# Patient Record
Sex: Female | Born: 1985 | Race: White | Hispanic: No | Marital: Married | Smoking: Never smoker
Health system: Southern US, Community
[De-identification: ages and names within clinical notes are randomized; demographics above are authoritative.]

## PROBLEM LIST (undated history)

## (undated) DIAGNOSIS — F329 Major depressive disorder, single episode, unspecified: Secondary | ICD-10-CM

## (undated) DIAGNOSIS — Z860100 Personal history of colon polyps, unspecified: Secondary | ICD-10-CM

## (undated) DIAGNOSIS — K859 Acute pancreatitis without necrosis or infection, unspecified: Secondary | ICD-10-CM

## (undated) DIAGNOSIS — F32A Depression, unspecified: Secondary | ICD-10-CM

## (undated) DIAGNOSIS — K5190 Ulcerative colitis, unspecified, without complications: Secondary | ICD-10-CM

## (undated) DIAGNOSIS — F410 Panic disorder [episodic paroxysmal anxiety] without agoraphobia: Secondary | ICD-10-CM

## (undated) HISTORY — DX: Acute pancreatitis without necrosis or infection, unspecified: K85.90

## (undated) HISTORY — DX: Personal history of colon polyps, unspecified: Z86.0100

## (undated) HISTORY — DX: Personal history of colonic polyps: Z86.010

## (undated) HISTORY — DX: Ulcerative colitis, unspecified, without complications: K51.90

## (undated) HISTORY — DX: Panic disorder (episodic paroxysmal anxiety): F41.0

## (undated) HISTORY — PX: OTHER SURGICAL HISTORY: SHX169

## (undated) HISTORY — PX: COLONOSCOPY: SHX174

---

## 2007-02-04 ENCOUNTER — Ambulatory Visit: Payer: Self-pay | Admitting: Gastroenterology

## 2009-03-21 ENCOUNTER — Inpatient Hospital Stay: Payer: Self-pay | Admitting: Student

## 2009-04-22 ENCOUNTER — Ambulatory Visit: Payer: Self-pay | Admitting: Unknown Physician Specialty

## 2013-06-22 ENCOUNTER — Ambulatory Visit: Payer: Self-pay | Admitting: Gastroenterology

## 2013-06-22 LAB — HM COLONOSCOPY

## 2013-08-03 ENCOUNTER — Other Ambulatory Visit: Payer: Self-pay | Admitting: Gastroenterology

## 2013-08-04 LAB — CLOSTRIDIUM DIFFICILE(ARMC)

## 2015-03-01 ENCOUNTER — Other Ambulatory Visit: Payer: Self-pay | Admitting: Family Medicine

## 2015-03-16 ENCOUNTER — Ambulatory Visit (INDEPENDENT_AMBULATORY_CARE_PROVIDER_SITE_OTHER): Payer: BC Managed Care – PPO | Admitting: Family Medicine

## 2015-03-16 ENCOUNTER — Encounter: Payer: Self-pay | Admitting: Family Medicine

## 2015-03-16 VITALS — BP 112/70 | HR 56 | Temp 98.4°F | Resp 16 | Wt 187.0 lb

## 2015-03-16 DIAGNOSIS — F410 Panic disorder [episodic paroxysmal anxiety] without agoraphobia: Secondary | ICD-10-CM | POA: Insufficient documentation

## 2015-03-16 DIAGNOSIS — R0981 Nasal congestion: Secondary | ICD-10-CM | POA: Diagnosis not present

## 2015-03-16 DIAGNOSIS — Z86010 Personal history of colonic polyps: Secondary | ICD-10-CM | POA: Insufficient documentation

## 2015-03-16 DIAGNOSIS — K5190 Ulcerative colitis, unspecified, without complications: Secondary | ICD-10-CM | POA: Insufficient documentation

## 2015-03-16 MED ORDER — FLUTICASONE PROPIONATE 50 MCG/ACT NA SUSP: 2.0000 | Freq: Every day | NASAL | Status: DC

## 2015-03-16 NOTE — Progress Notes (Signed)
       Patient: Jo Perkins Female    DOB: 1986-03-02   29 y.o.   MRN: [id number] Visit Date: 03/16/2015  Today's Provider: Lelon Huh, MD   Chief Complaint  Patient presents with  . Nasal Congestion   Subjective:    HPI Pt reports that she had a cold in May which mostly resolved.About one week ago she started having persistent nasal congestion and started using Afrin 2-3 times a day. She now waking up in the morning unable to breath out of her nose and is concerned she has become dependent on Afrin.     Previous Medications   CLONAZEPAM (KLONOPIN) 0.5 MG TABLET    Take by mouth. 1 tablet every morning, then  1/2 tablet 1-2 more times daily as needed   MESALAMINE (APRISO) 0.375 G 24 HR CAPSULE    Take 4 capsules by mouth daily.   SERTRALINE (ZOLOFT) 100 MG TABLET    Take 1 tablet (100 mg total) by mouth daily.    Review of Systems  Constitutional: Positive for fatigue.  HENT: Positive for congestion and sneezing.   Eyes: Negative.   Respiratory: Negative.   Cardiovascular: Negative.   Gastrointestinal: Negative.   Endocrine: Negative.   Genitourinary: Negative.   Musculoskeletal: Negative.   Skin: Negative.   Allergic/Immunologic: Negative.   Neurological: Negative.   Hematological: Negative.  Does not bruise/bleed easily.  Psychiatric/Behavioral: Negative.     History  Substance Use Topics  . Smoking status: Never Smoker   . Smokeless tobacco: Not on file  . Alcohol Use: 0.0 oz/week    0 Standard drinks or equivalent per week     Comment: rare   Objective:   BP 112/70 mmHg  Pulse 56  Temp(Src) 98.4 F (36.9 C) (Oral)  Resp 16  Wt 187 lb (84.823 kg)  Physical Exam   General Appearance:    Alert, cooperative, no distress  ENT:   Nasal mucosa pale and congested with small amount of clear drainage.   Eyes:    PERRL, conjunctiva/corneas clear, EOM's intact       Lungs:     Clear to auscultation bilaterally, respirations unlabored  Heart:    Regular rate  and rhythm  Neurologic:   Awake, alert, oriented x 3. No apparent focal neurological           defect.           Assessment & Plan:     1. 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 Suspect she has some underlying allergies as well. Counseled no to use nasal decongestants at all and start using nasal saline a few times a day. Can also try nasal steroid. Call if not much better in a week.  - fluticasone (FLONASE) 50 MCG/ACT nasal spray; Place 2 sprays into both nostrils daily.  Dispense: 16 g; Refill: 6    Lelon Huh, MD  [facility]

## 2015-03-16 NOTE — Patient Instructions (Addendum)
   Use OTC saline nasal spray every 3-4 hours.   Do NOT use nasal decongestants such as Afrin

## 2015-03-17 ENCOUNTER — Ambulatory Visit: Payer: Self-pay | Admitting: Family Medicine

## 2015-06-16 ENCOUNTER — Other Ambulatory Visit: Payer: Self-pay | Admitting: Family Medicine

## 2015-06-16 NOTE — Telephone Encounter (Signed)
Please call in clonazepam

## 2015-06-17 NOTE — Telephone Encounter (Signed)
prescription called into pharmacy

## 2016-02-06 ENCOUNTER — Other Ambulatory Visit: Payer: Self-pay | Admitting: Family Medicine

## 2016-02-06 NOTE — Telephone Encounter (Signed)
Please call in clonazepam and advise patient that she is due for o.v. In June

## 2016-02-07 NOTE — Telephone Encounter (Signed)
Rx called into pharmacy. Called pt to advise she is due for ov. No answer and message that pt is not available. Will try again later.

## 2016-02-08 ENCOUNTER — Ambulatory Visit (INDEPENDENT_AMBULATORY_CARE_PROVIDER_SITE_OTHER): Payer: BC Managed Care – PPO | Admitting: Family Medicine

## 2016-02-08 ENCOUNTER — Encounter: Payer: Self-pay | Admitting: Family Medicine

## 2016-02-08 VITALS — BP 94/68 | HR 60 | Temp 98.6°F | Resp 16 | Wt 186.0 lb

## 2016-02-08 DIAGNOSIS — F410 Panic disorder [episodic paroxysmal anxiety] without agoraphobia: Secondary | ICD-10-CM | POA: Diagnosis not present

## 2016-02-08 DIAGNOSIS — K529 Noninfective gastroenteritis and colitis, unspecified: Secondary | ICD-10-CM | POA: Diagnosis not present

## 2016-02-08 MED ORDER — CLONAZEPAM 0.5 MG PO TABS: ORAL_TABLET | ORAL | Status: DC

## 2016-02-08 NOTE — Progress Notes (Signed)
       Patient: Jo Perkins Female    DOB: 1985/11/20   30 y.o.   MRN: [id number] Visit Date: 02/08/2016  Today's Provider: Lelon Huh, MD   Chief Complaint  Patient presents with  . Follow-up  . Panic Attack   Subjective:    HPI  Follow-up for panic attacks due to stress from 04/25/2014; increased Zoloft to 169m a day which she continues to take every day. Is tolerating well, mood is very good. Rarely gets the feeling she is about to have panic attack. She that happens she will usually take a klonipin which works very well.  Patient has had a bout of diarrhea and nausea sine Monday evening 5/232/2017. Had diarrhea Monday evening after she ate supper. Diarrhea lasted for a few hours. Since then has had some nausea and fatigue.     Allergies  Allergen Reactions  . Imuran  [Azathioprine]     caused pancreatitis   Previous Medications   CLONAZEPAM (KLONOPIN) 0.5 MG TABLET    TAKE 1 TABLET BY MOUTH IN THE MORNING AND 1/2 TABLET ONCE TO TWICE DAILY AS NEEDED   MESALAMINE (APRISO) 0.375 G 24 HR CAPSULE    Take 4 capsules by mouth daily.   SERTRALINE (ZOLOFT) 100 MG TABLET    Take 1 tablet (100 mg total) by mouth daily.    Review of Systems  Constitutional: Positive for fatigue. Negative for fever, chills and appetite change.  Respiratory: Negative for chest tightness and shortness of breath.   Cardiovascular: Negative for chest pain and palpitations.  Gastrointestinal: Positive for nausea and diarrhea. Negative for vomiting and abdominal pain.  Neurological: Negative for dizziness and weakness.    Social History  Substance Use Topics  . Smoking status: Never Smoker   . Smokeless tobacco: Not on file  . Alcohol Use: 0.0 oz/week    0 Standard drinks or equivalent per week     Comment: rare   Objective:   BP 94/68 mmHg  Pulse 60  Temp(Src) 98.6 F (37 C) (Oral)  Resp 16  Wt 186 lb (84.369 kg)  LMP 01/30/2016  Physical Exam  General Appearance:    Alert,  cooperative, no distress  Eyes:    PERRL, conjunctiva/corneas clear, EOM's intact       Lungs:     Clear to auscultation bilaterally, respirations unlabored  Heart:    Regular rate and rhythm  Abdomen:   bowel sounds present and normal in all 4 quadrants. No CVA tenderness        Assessment & Plan:     1. Panic Well controlled. She inquires whether or not she should stop taking medications. I think her panic disorder is long-standing, but advised her she could try cutting dose to 1/2 tablet a day. I would not recommend stopping all together. Refill clonazepam. Follow up in 1 year if doing well.   2. Gastroenteritis Nearly resolved. Advised to avoid dairy. Expect complete resolution in the next few days.        DLelon Huh MD  [facility]

## 2016-03-05 ENCOUNTER — Other Ambulatory Visit: Payer: Self-pay | Admitting: Family Medicine

## 2016-03-28 ENCOUNTER — Other Ambulatory Visit: Payer: Self-pay | Admitting: Nurse Practitioner

## 2016-03-28 ENCOUNTER — Ambulatory Visit
Admission: RE | Admit: 2016-03-28 | Discharge: 2016-03-28 | Disposition: A | Discharge status: Home / Self Care | Payer: BC Managed Care – PPO | Source: Ambulatory Visit | Attending: Nurse Practitioner | Admitting: Nurse Practitioner

## 2016-03-28 DIAGNOSIS — R109 Unspecified abdominal pain: Secondary | ICD-10-CM | POA: Insufficient documentation

## 2016-03-28 DIAGNOSIS — K859 Acute pancreatitis without necrosis or infection, unspecified: Secondary | ICD-10-CM | POA: Diagnosis not present

## 2016-03-28 DIAGNOSIS — Z8719 Personal history of other diseases of the digestive system: Secondary | ICD-10-CM | POA: Insufficient documentation

## 2016-03-28 DIAGNOSIS — R1909 Other intra-abdominal and pelvic swelling, mass and lump: Secondary | ICD-10-CM | POA: Insufficient documentation

## 2016-03-28 DIAGNOSIS — K8020 Calculus of gallbladder without cholecystitis without obstruction: Secondary | ICD-10-CM | POA: Insufficient documentation

## 2016-03-28 DIAGNOSIS — N289 Disorder of kidney and ureter, unspecified: Secondary | ICD-10-CM | POA: Insufficient documentation

## 2016-03-28 DIAGNOSIS — K51919 Ulcerative colitis, unspecified with unspecified complications: Secondary | ICD-10-CM

## 2016-03-28 MED ORDER — IOPAMIDOL (ISOVUE-300) INJECTION 61%
100.0000 mL | Freq: Once | INTRAVENOUS | Status: AC | PRN
  Administered 2016-03-28: 100 mL via INTRAVENOUS

## 2016-03-29 ENCOUNTER — Other Ambulatory Visit
Admission: RE | Admit: 2016-03-29 | Discharge: 2016-03-29 | Disposition: A | Discharge status: Home / Self Care | Payer: BC Managed Care – PPO | Source: Ambulatory Visit | Attending: Nurse Practitioner | Admitting: Nurse Practitioner

## 2016-03-29 ENCOUNTER — Emergency Department: Payer: BC Managed Care – PPO

## 2016-03-29 ENCOUNTER — Inpatient Hospital Stay
Admission: EM | Admit: 2016-03-29 | Discharge: 2016-03-31 | DRG: 439 | Disposition: A | Discharge status: Home / Self Care | Payer: BC Managed Care – PPO | Attending: Internal Medicine | Admitting: Internal Medicine

## 2016-03-29 ENCOUNTER — Encounter: Payer: Self-pay | Admitting: Urgent Care

## 2016-03-29 DIAGNOSIS — K51911 Ulcerative colitis, unspecified with rectal bleeding: Secondary | ICD-10-CM | POA: Diagnosis present

## 2016-03-29 DIAGNOSIS — R748 Abnormal levels of other serum enzymes: Secondary | ICD-10-CM | POA: Insufficient documentation

## 2016-03-29 DIAGNOSIS — K5190 Ulcerative colitis, unspecified, without complications: Secondary | ICD-10-CM | POA: Diagnosis present

## 2016-03-29 DIAGNOSIS — K861 Other chronic pancreatitis: Secondary | ICD-10-CM | POA: Diagnosis present

## 2016-03-29 DIAGNOSIS — R197 Diarrhea, unspecified: Secondary | ICD-10-CM | POA: Diagnosis not present

## 2016-03-29 DIAGNOSIS — F329 Major depressive disorder, single episode, unspecified: Secondary | ICD-10-CM | POA: Diagnosis present

## 2016-03-29 DIAGNOSIS — Z79899 Other long term (current) drug therapy: Secondary | ICD-10-CM

## 2016-03-29 DIAGNOSIS — K859 Acute pancreatitis without necrosis or infection, unspecified: Principal | ICD-10-CM | POA: Insufficient documentation

## 2016-03-29 DIAGNOSIS — Z86010 Personal history of colonic polyps: Secondary | ICD-10-CM

## 2016-03-29 DIAGNOSIS — Z833 Family history of diabetes mellitus: Secondary | ICD-10-CM

## 2016-03-29 DIAGNOSIS — R739 Hyperglycemia, unspecified: Secondary | ICD-10-CM | POA: Diagnosis present

## 2016-03-29 DIAGNOSIS — N390 Urinary tract infection, site not specified: Secondary | ICD-10-CM | POA: Diagnosis present

## 2016-03-29 DIAGNOSIS — Z8719 Personal history of other diseases of the digestive system: Secondary | ICD-10-CM

## 2016-03-29 DIAGNOSIS — Z888 Allergy status to other drugs, medicaments and biological substances status: Secondary | ICD-10-CM

## 2016-03-29 HISTORY — DX: Major depressive disorder, single episode, unspecified: F32.9

## 2016-03-29 HISTORY — DX: Depression, unspecified: F32.A

## 2016-03-29 LAB — COMPREHENSIVE METABOLIC PANEL
ALBUMIN: 4.1 g/dL (ref 3.5–5.0)
ALK PHOS: 63 U/L (ref 38–126)
ALT: 12 U/L — ABNORMAL LOW (ref 14–54)
ANION GAP: 6 (ref 5–15)
AST: 21 U/L (ref 15–41)
BUN: 9 mg/dL (ref 6–20)
CALCIUM: 9.4 mg/dL (ref 8.9–10.3)
CHLORIDE: 106 mmol/L (ref 101–111)
CO2: 26 mmol/L (ref 22–32)
Creatinine, Ser: 0.79 mg/dL (ref 0.44–1.00)
GFR calc non Af Amer: >60 mL/min (ref >60)
GLUCOSE: 116 mg/dL — AB (ref 65–99)
POTASSIUM: 4 mmol/L (ref 3.5–5.1)
SODIUM: 138 mmol/L (ref 135–145)
Total Bilirubin: 0.3 mg/dL (ref 0.3–1.2)
Total Protein: 7.5 g/dL (ref 6.5–8.1)

## 2016-03-29 LAB — URINALYSIS COMPLETE WITH MICROSCOPIC (ARMC ONLY)
BACTERIA UA: NONE SEEN
Bilirubin Urine: NEGATIVE
GLUCOSE, UA: NEGATIVE mg/dL
Ketones, ur: NEGATIVE mg/dL
Nitrite: NEGATIVE
PROTEIN: NEGATIVE mg/dL
SPECIFIC GRAVITY, URINE: 1.014 (ref 1.005–1.030)
pH: 7 (ref 5.0–8.0)

## 2016-03-29 LAB — GASTROINTESTINAL PANEL BY PCR, STOOL (REPLACES STOOL CULTURE)
Adenovirus F40/41: NOT DETECTED
Astrovirus: NOT DETECTED
CAMPYLOBACTER SPECIES: NOT DETECTED
CRYPTOSPORIDIUM: NOT DETECTED
Cyclospora cayetanensis: NOT DETECTED
E. COLI O157: NOT DETECTED
ENTEROAGGREGATIVE E COLI (EAEC): NOT DETECTED
Entamoeba histolytica: NOT DETECTED
Enteropathogenic E coli (EPEC): NOT DETECTED
Enterotoxigenic E coli (ETEC): NOT DETECTED
GIARDIA LAMBLIA: NOT DETECTED
NOROVIRUS GI/GII: NOT DETECTED
PLESIMONAS SHIGELLOIDES: NOT DETECTED
Rotavirus A: NOT DETECTED
SALMONELLA SPECIES: NOT DETECTED
SAPOVIRUS (I, II, IV, AND V): NOT DETECTED
SHIGELLA/ENTEROINVASIVE E COLI (EIEC): NOT DETECTED
Shiga like toxin producing E coli (STEC): NOT DETECTED
Vibrio cholerae: NOT DETECTED
Vibrio species: NOT DETECTED
YERSINIA ENTEROCOLITICA: NOT DETECTED

## 2016-03-29 LAB — CBC
HEMATOCRIT: 36.9 % (ref 35.0–47.0)
HEMOGLOBIN: 12.5 g/dL (ref 12.0–16.0)
MCH: 28.4 pg (ref 26.0–34.0)
MCHC: 33.7 g/dL (ref 32.0–36.0)
MCV: 84.2 fL (ref 80.0–100.0)
Platelets: 270 10*3/uL (ref 150–440)
RBC: 4.38 MIL/uL (ref 3.80–5.20)
RDW: 14.2 % (ref 11.5–14.5)
WBC: 9.3 10*3/uL (ref 3.6–11.0)

## 2016-03-29 LAB — LIPASE, BLOOD: LIPASE: 1375 U/L — AB (ref 11–51)

## 2016-03-29 LAB — C DIFFICILE QUICK SCREEN W PCR REFLEX
C DIFFICILE (CDIFF) INTERP: NOT DETECTED
C DIFFICILE (CDIFF) TOXIN: NEGATIVE
C DIFFICLE (CDIFF) ANTIGEN: NEGATIVE

## 2016-03-29 LAB — POCT PREGNANCY, URINE: PREG TEST UR: NEGATIVE

## 2016-03-29 MED ORDER — ONDANSETRON HCL 4 MG/2ML IJ SOLN
4.0000 mg | Freq: Once | INTRAMUSCULAR | Status: AC
  Administered 2016-03-29: 4 mg via INTRAVENOUS
  Filled 2016-03-29: qty 2

## 2016-03-29 MED ORDER — MORPHINE SULFATE (PF) 4 MG/ML IV SOLN
4.0000 mg | Freq: Once | INTRAVENOUS | Status: AC
  Administered 2016-03-29: 4 mg via INTRAVENOUS
  Filled 2016-03-29: qty 1

## 2016-03-29 NOTE — ED Notes (Signed)
Patient asked for something for her abdominal pain, MD gave VO for 4mg  morphine IV, 4mg  zofran IV - RB by this RN.  This RN will enter orders and administer.

## 2016-03-29 NOTE — ED Notes (Signed)
Patient has hx of UC and has been treated by primary care doc and her pain has been increasing steadily.  She has taken a stool sample and had an appt with her doctor but her pain is increasing to the point she came to the ER for relief.

## 2016-03-29 NOTE — ED Notes (Signed)
Patient presents with c/o diffuse abdominal pain. Patient with an ulcerative colitis Dx. [facility] and [facility]; took stool sample back in this am. CBC and CMP were normal; CRP 7; Sed rate 51. Patient reports that the provider told her that she wanted to get the results back from the stool sample prior to starting therapy, however ABX and steroids were the likely course of treatment. Patient presents tonight reporting that she has been unable to get in touch with the GI provider Daphine Deutscher([place] and that her pain is "getting worse".

## 2016-03-29 NOTE — ED Notes (Signed)
Spoke with Inocencio HomesGayle, MD - wants abdominal protocol only at this time.

## 2016-03-29 NOTE — ED Notes (Signed)
Patient transported to Ultrasound 

## 2016-03-29 NOTE — ED Provider Notes (Signed)
[facility] Emergency Department Provider Note   ____________________________________________  Time seen: Approximately 9:56 PM  I have reviewed the triage vital signs and the nursing notes.   HISTORY  Chief Complaint Abdominal Pain    HPI Jo Perkins is a 30 y.o. female patient reports increasing abdominal pain. The CT and an ultrasound yesterday which only showed some gallstones. She has a history of ulcerative colitis. She is not having any diarrhea. She says this pain reminds her one time of when she had pancreatitis from allergic reaction to Imuran. He is not currently having any nausea or vomiting. The pain is moderately severe. Achy in the upper abdomen. It's getting gradually worse but nothing she does seems to help one way or the other. She has blood in urine but reports she is on her menstrual period.   Past Medical History  Diagnosis Date  . Panic attack   . History of colon polyps   . Ulcerative colitis ([place])   . Depression     Patient Active Problem List   Diagnosis Date Noted  . History of colon polyps 03/16/2015  . Panic 03/16/2015  . Colitis gravis ([place]) 03/16/2015    History reviewed. No pertinent past surgical history.  Current Outpatient Rx  Name  Route  Sig  Dispense  Refill  . ciprofloxacin (CIPRO) 500 MG tablet   Oral   Take 500 mg by mouth 2 (two) times daily.         . clonazePAM (KLONOPIN) 0.5 MG tablet      1/2-1 tablet every four hours as needed. Patient taking differently: Take 0.5 mg by mouth 2 (two) times daily. 0.5 mg in the am and .25-0.5 mg as needed   30 tablet   1   . Mesalamine (ASACOL) 400 MG CPDR DR capsule   Oral   Take 800 mg by mouth 3 (three) times daily.          . sertraline (ZOLOFT) 100 MG tablet      TAKE 1 TABLET BY MOUTH EVERY DAY   30 tablet   11     Allergies Imuran   Family History  Problem Relation Age of Onset  . Diabetes Maternal Grandmother     type 2  . Stroke  Maternal Grandmother   . Heart attack Maternal Grandmother   . Lung cancer Maternal Grandfather     Social History Social History  Substance Use Topics  . Smoking status: Never Smoker   . Smokeless tobacco: None  . Alcohol Use: 0.0 oz/week    0 Standard drinks or equivalent per week     Comment: rare    Review of Systems Constitutional: No fever/chills Eyes: No visual changes. ENT: No sore throat. Cardiovascular: Denies chest pain. Respiratory: Denies shortness of breath. Gastrointestinal: See history of present illness Genitourinary: Negative for dysuria. Musculoskeletal: Negative for back pain. Skin: Negative for rash. Neurological: Negative for headaches, focal weakness or numbness.  10-point ROS otherwise negative.  ____________________________________________   PHYSICAL EXAM:  VITAL SIGNS: ED Triage Vitals  Enc Vitals Group     BP 03/29/16 1958 114/76 mmHg     Pulse Rate 03/29/16 1958 63     Resp 03/29/16 1958 16     Temp 03/29/16 1958 98 F (36.7 C)     Temp Source 03/29/16 1958 Oral     SpO2 03/29/16 1958 98 %     Weight 03/29/16 1958 177 lb (80.287 kg)     Height  03/29/16 1958 5' 9"  (1.753 m)     Head Cir --      Peak Flow --      Pain Score 03/29/16 1958 7     Pain Loc --      Pain Edu? --      Excl. in [place]? --     Constitutional: Alert and oriented. Well appearing and in no acute distress. Eyes: Conjunctivae are normal. PERRL. EOMI. Head: Atraumatic. Nose: No congestion/rhinnorhea. Mouth/Throat: Mucous membranes are moist.  Oropharynx non-erythematous. Neck: No stridor Cardiovascular: Normal rate, regular rhythm. Grossly normal heart sounds.  Good peripheral circulation. Respiratory: Normal respiratory effort.  No retractions. Lungs CTAB. Gastrointestinal: Soft and Mildly tender palpation and percussion in the upper abdomen No distention. No abdominal bruits. No CVA tenderness. }Musculoskeletal: No lower extremity tenderness nor edema.  No joint  effusions. Neurologic:  Normal speech and language. No gross focal neurologic deficits are appreciated. No gait instability. Skin:  Skin is warm, dry and intact. No rash noted. Psychiatric: Mood and affect are normal. Speech and behavior are normal.  ____________________________________________   LABS (all labs ordered are listed, but only abnormal results are displayed)  Labs Reviewed  LIPASE, BLOOD - Abnormal; Notable for the following:    Lipase 1375 (*)    All other components within normal limits  COMPREHENSIVE METABOLIC PANEL - Abnormal; Notable for the following:    Glucose, Bld 116 (*)    ALT 12 (*)    All other components within normal limits  URINALYSIS COMPLETEWITH MICROSCOPIC ([facility] ONLY) - Abnormal; Notable for the following:    Color, Urine YELLOW (*)    APPearance CLEAR (*)    Hgb urine dipstick 3+ (*)    Leukocytes, UA 1+ (*)    Squamous Epithelial / LPF 0-5 (*)    All other components within normal limits  CBC  PREGNANCY, URINE  POCT PREGNANCY, URINE   ____________________________________________  EKG   ____________________________________________  RADIOLOGY  Ultrasound pending ____________________________________________   PROCEDURES    Procedures    ____________________________________________   INITIAL IMPRESSION / ASSESSMENT AND PLAN / ED COURSE  Pertinent labs & imaging results that were available during my care of the patient were reviewed by me and considered in my medical decision making (see chart for details).   ____________________________________________   FINAL CLINICAL IMPRESSION(S) / ED DIAGNOSES  Final diagnoses:  Acute pancreatitis, unspecified pancreatitis type      NEW MEDICATIONS STARTED DURING THIS VISIT:  New Prescriptions   No medications on file     Note:  This document was prepared using Dragon voice recognition software and may include unintentional dictation errors.    Nena Polio,  MD 03/29/16 2200

## 2016-03-29 NOTE — ED Notes (Signed)
Discussed IV options with patient, AC placement preferred. 

## 2016-03-29 NOTE — ED Notes (Signed)
MD at bedside. 

## 2016-03-29 NOTE — ED Notes (Signed)
Pt has ulcerative colitis states feels like she is having a flare up

## 2016-03-30 ENCOUNTER — Encounter: Payer: Self-pay | Admitting: Internal Medicine

## 2016-03-30 DIAGNOSIS — R739 Hyperglycemia, unspecified: Secondary | ICD-10-CM | POA: Diagnosis present

## 2016-03-30 DIAGNOSIS — Z8719 Personal history of other diseases of the digestive system: Secondary | ICD-10-CM | POA: Diagnosis not present

## 2016-03-30 DIAGNOSIS — K859 Acute pancreatitis without necrosis or infection, unspecified: Secondary | ICD-10-CM | POA: Insufficient documentation

## 2016-03-30 DIAGNOSIS — R748 Abnormal levels of other serum enzymes: Secondary | ICD-10-CM

## 2016-03-30 DIAGNOSIS — Z833 Family history of diabetes mellitus: Secondary | ICD-10-CM | POA: Diagnosis not present

## 2016-03-30 DIAGNOSIS — K861 Other chronic pancreatitis: Secondary | ICD-10-CM | POA: Diagnosis present

## 2016-03-30 DIAGNOSIS — Z79899 Other long term (current) drug therapy: Secondary | ICD-10-CM | POA: Diagnosis not present

## 2016-03-30 DIAGNOSIS — K5190 Ulcerative colitis, unspecified, without complications: Secondary | ICD-10-CM | POA: Diagnosis present

## 2016-03-30 DIAGNOSIS — F329 Major depressive disorder, single episode, unspecified: Secondary | ICD-10-CM | POA: Diagnosis present

## 2016-03-30 DIAGNOSIS — N390 Urinary tract infection, site not specified: Secondary | ICD-10-CM | POA: Diagnosis present

## 2016-03-30 DIAGNOSIS — R109 Unspecified abdominal pain: Secondary | ICD-10-CM | POA: Diagnosis present

## 2016-03-30 DIAGNOSIS — Z888 Allergy status to other drugs, medicaments and biological substances status: Secondary | ICD-10-CM | POA: Diagnosis not present

## 2016-03-30 DIAGNOSIS — Z86010 Personal history of colonic polyps: Secondary | ICD-10-CM | POA: Diagnosis not present

## 2016-03-30 LAB — HEMOGLOBIN A1C: Hgb A1c MFr Bld: 5.2 % (ref 4.0–6.0)

## 2016-03-30 LAB — TSH: TSH: 3.928 u[IU]/mL (ref 0.350–4.500)

## 2016-03-30 MED ORDER — DICYCLOMINE HCL 10 MG PO CAPS
10.0000 mg | ORAL_CAPSULE | Freq: Four times a day (QID) | ORAL | Status: DC
  Administered 2016-03-30 – 2016-03-31 (×5): 10 mg via ORAL
  Filled 2016-03-30 (×5): qty 1

## 2016-03-30 MED ORDER — ENOXAPARIN SODIUM 40 MG/0.4ML ~~LOC~~ SOLN: 40.0000 mg | SUBCUTANEOUS | Status: DC

## 2016-03-30 MED ORDER — BOOST / RESOURCE BREEZE PO LIQD
1.0000 | Freq: Three times a day (TID) | ORAL | Status: DC
  Administered 2016-03-30 (×2): 1 via ORAL

## 2016-03-30 MED ORDER — ONDANSETRON HCL 4 MG/2ML IJ SOLN
4.0000 mg | Freq: Four times a day (QID) | INTRAMUSCULAR | Status: DC | PRN
  Administered 2016-03-30: 4 mg via INTRAVENOUS

## 2016-03-30 MED ORDER — HYDROCODONE-ACETAMINOPHEN 5-325 MG PO TABS
1.0000 | ORAL_TABLET | ORAL | Status: DC | PRN
  Administered 2016-03-30 (×3): 2 via ORAL
  Administered 2016-03-30: 02:00:00 1 via ORAL
  Administered 2016-03-31: 2 via ORAL
  Filled 2016-03-30: qty 2
  Filled 2016-03-30: qty 1
  Filled 2016-03-30 (×3): qty 2

## 2016-03-30 MED ORDER — MORPHINE SULFATE (PF) 2 MG/ML IV SOLN
1.0000 mg | INTRAVENOUS | Status: DC | PRN
  Administered 2016-03-30 (×2): 1 mg via INTRAVENOUS
  Filled 2016-03-30 (×2): qty 1

## 2016-03-30 MED ORDER — ONDANSETRON HCL 4 MG PO TABS: 4.0000 mg | ORAL_TABLET | Freq: Four times a day (QID) | ORAL | Status: DC | PRN

## 2016-03-30 MED ORDER — SERTRALINE HCL 100 MG PO TABS
100.0000 mg | ORAL_TABLET | Freq: Every day | ORAL | Status: DC
  Administered 2016-03-30 – 2016-03-31 (×2): 100 mg via ORAL
  Filled 2016-03-30 (×2): qty 1

## 2016-03-30 MED ORDER — MORPHINE SULFATE (PF) 2 MG/ML IV SOLN
INTRAVENOUS | Status: AC
  Administered 2016-03-30: 1 mg via INTRAVENOUS
  Filled 2016-03-30: qty 1

## 2016-03-30 MED ORDER — DEXTROSE 5 % IV SOLN
1.0000 g | INTRAVENOUS | Status: DC
  Administered 2016-03-30 – 2016-03-31 (×2): 1 g via INTRAVENOUS
  Filled 2016-03-30 (×2): qty 10

## 2016-03-30 MED ORDER — DOCUSATE SODIUM 100 MG PO CAPS
100.0000 mg | ORAL_CAPSULE | Freq: Two times a day (BID) | ORAL | Status: DC
  Administered 2016-03-30 (×2): 100 mg via ORAL
  Filled 2016-03-30 (×2): qty 1

## 2016-03-30 MED ORDER — ACETAMINOPHEN 650 MG RE SUPP
650.0000 mg | Freq: Four times a day (QID) | RECTAL | Status: DC | PRN
Start: 2016-03-30 — End: 2016-03-31

## 2016-03-30 MED ORDER — ACETAMINOPHEN 325 MG PO TABS: 650.0000 mg | ORAL_TABLET | Freq: Four times a day (QID) | ORAL | Status: DC | PRN

## 2016-03-30 MED ORDER — MESALAMINE 400 MG PO CPDR
800.0000 mg | DELAYED_RELEASE_CAPSULE | Freq: Three times a day (TID) | ORAL | Status: DC
  Administered 2016-03-30 – 2016-03-31 (×4): 800 mg via ORAL
  Filled 2016-03-30 (×6): qty 2

## 2016-03-30 MED ORDER — ONDANSETRON HCL 4 MG/2ML IJ SOLN
INTRAMUSCULAR | Status: AC
  Administered 2016-03-30: 4 mg via INTRAVENOUS
  Filled 2016-03-30: qty 2

## 2016-03-30 MED ORDER — CLONAZEPAM 0.5 MG PO TABS
0.2500 mg | ORAL_TABLET | Freq: Two times a day (BID) | ORAL | Status: DC
  Administered 2016-03-30 (×2): 0.5 mg via ORAL
  Filled 2016-03-30 (×3): qty 1

## 2016-03-30 MED ORDER — SODIUM CHLORIDE 0.9 % IV SOLN
INTRAVENOUS | Status: DC
  Administered 2016-03-30 – 2016-03-31 (×5): via INTRAVENOUS

## 2016-03-30 MED ORDER — BUDESONIDE 3 MG PO CPEP
9.0000 mg | ORAL_CAPSULE | Freq: Every day | ORAL | Status: DC
  Administered 2016-03-30 – 2016-03-31 (×2): 9 mg via ORAL
  Filled 2016-03-30 (×2): qty 3

## 2016-03-30 NOTE — H&P (Signed)
Jo Perkins is an 30 y.o. female.   Chief Complaint: Abdominal pain HPI: The patient with past medical history of ulcerative colitis presents emergency department complaining of abdominal pain. She also admits to diarrhea that has had a lot of mucus and it for the last 2 weeks. She had been out of town but was able to see her gastroenterologist when she returned. C. difficile assay was obtained and the patient had a CT of the abdomen the day before admission which showed noncalcified gallstones and sludge within the gallbladder. Her pain was waxing and waning at that point but worsened and became constant today. In the emergency Department laboratory evaluation revealed elevated lipase as well as UTI. Due to these findings emergency department staff called for admission.  Past Medical History  Diagnosis Date  . Panic attack   . History of colon polyps   . Ulcerative colitis ([place])   . Depression     Past Surgical History  Procedure Laterality Date  . None      Family History  Problem Relation Age of Onset  . Diabetes Maternal Grandmother     type 2  . Stroke Maternal Grandmother   . Heart attack Maternal Grandmother   . Lung cancer Maternal Grandfather    Social History:  reports that she has never smoked. She does not have any smokeless tobacco history on file. She reports that she drinks alcohol. She reports that she does not use illicit drugs.  Allergies:  Allergies  Allergen Reactions  . Imuran  [Azathioprine]     caused pancreatitis    Medications Prior to Admission  Medication Sig Dispense Refill  . ciprofloxacin (CIPRO) 500 MG tablet Take 500 mg by mouth 2 (two) times daily.    . clonazePAM (KLONOPIN) 0.5 MG tablet 1/2-1 tablet every four hours as needed. (Patient taking differently: Take 0.5 mg by mouth 2 (two) times daily. 0.5 mg in the am and .25-0.5 mg as needed) 30 tablet 1  . dicyclomine (BENTYL) 10 MG capsule Take 10 mg by mouth 4 (four) times daily.    .  Mesalamine (ASACOL) 400 MG CPDR DR capsule Take 800 mg by mouth 3 (three) times daily.     . metroNIDAZOLE (FLAGYL) 250 MG tablet Take 250 mg by mouth 4 (four) times daily.    . sertraline (ZOLOFT) 100 MG tablet TAKE 1 TABLET BY MOUTH EVERY DAY 30 tablet 11    Results for orders placed or performed during the hospital encounter of 03/29/16 (from the past 48 hour(s))  Lipase, blood     Status: Abnormal   Collection Time: 03/29/16  8:09 PM  Result Value Ref Range   Lipase 1375 (H) 11 - 51 U/L    Comment: RESULT CONFIRMED BY MANUAL DILUTION  Comprehensive metabolic panel     Status: Abnormal   Collection Time: 03/29/16  8:09 PM  Result Value Ref Range   Sodium 138 135 - 145 mmol/L   Potassium 4.0 3.5 - 5.1 mmol/L   Chloride 106 101 - 111 mmol/L   CO2 26 22 - 32 mmol/L   Glucose, Bld 116 (H) 65 - 99 mg/dL   BUN 9 6 - 20 mg/dL   Creatinine, Ser 0.79 0.44 - 1.00 mg/dL   Calcium 9.4 8.9 - 10.3 mg/dL   Total Protein 7.5 6.5 - 8.1 g/dL   Albumin 4.1 3.5 - 5.0 g/dL   AST 21 15 - 41 U/L   ALT 12 (L) 14 - 54 U/L  Alkaline Phosphatase 63 38 - 126 U/L   Total Bilirubin 0.3 0.3 - 1.2 mg/dL   GFR calc non Af Amer >60 >60 mL/min   GFR calc Af Amer >60 >60 mL/min    Comment: (NOTE) The eGFR has been calculated using the CKD EPI equation. This calculation has not been validated in all clinical situations. eGFR's persistently <60 mL/min signify possible Chronic Kidney Disease.    Anion gap 6 5 - 15  CBC     Status: None   Collection Time: 03/29/16  8:09 PM  Result Value Ref Range   WBC 9.3 3.6 - 11.0 K/uL   RBC 4.38 3.80 - 5.20 MIL/uL   Hemoglobin 12.5 12.0 - 16.0 g/dL   HCT 36.9 35.0 - 47.0 %   MCV 84.2 80.0 - 100.0 fL   MCH 28.4 26.0 - 34.0 pg   MCHC 33.7 32.0 - 36.0 g/dL   RDW 14.2 11.5 - 14.5 %   Platelets 270 150 - 440 K/uL  Hemoglobin A1c     Status: None   Collection Time: 03/29/16  8:09 PM  Result Value Ref Range   Hgb A1c MFr Bld 5.2 4.0 - 6.0 %  TSH     Status: None    Collection Time: 03/29/16  8:09 PM  Result Value Ref Range   TSH 3.928 0.350 - 4.500 uIU/mL  Urinalysis complete, with microscopic     Status: Abnormal   Collection Time: 03/29/16  8:10 PM  Result Value Ref Range   Color, Urine YELLOW (A) YELLOW   APPearance CLEAR (A) CLEAR   Glucose, UA NEGATIVE NEGATIVE mg/dL   Bilirubin Urine NEGATIVE NEGATIVE   Ketones, ur NEGATIVE NEGATIVE mg/dL   Specific Gravity, Urine 1.014 1.005 - 1.030   Hgb urine dipstick 3+ (A) NEGATIVE   pH 7.0 5.0 - 8.0   Protein, ur NEGATIVE NEGATIVE mg/dL   Nitrite NEGATIVE NEGATIVE   Leukocytes, UA 1+ (A) NEGATIVE   RBC / HPF TOO NUMEROUS TO COUNT 0 - 5 RBC/hpf   WBC, UA 6-30 0 - 5 WBC/hpf   Bacteria, UA NONE SEEN NONE SEEN   Squamous Epithelial / LPF 0-5 (A) NONE SEEN  Pregnancy, urine POC     Status: None   Collection Time: 03/29/16  8:13 PM  Result Value Ref Range   Preg Test, Ur NEGATIVE NEGATIVE    Comment:        THE SENSITIVITY OF THIS METHODOLOGY IS >24 mIU/mL    US Abdomen Complete  03/29/2016  CLINICAL DATA:  Elevated lipase. Abdominal pain for 6 days. History of ulcerative colitis. EXAM: ABDOMEN ULTRASOUND COMPLETE COMPARISON:  CT abdomen and pelvis 03/28/2016 FINDINGS: Gallbladder: No gallstones or wall thickening visualized. No sonographic Murphy sign noted by sonographer. Common bile duct: Diameter: 3.5 mm, normal Liver: No focal lesion identified. Within normal limits in parenchymal echogenicity. IVC: No abnormality visualized. Pancreas: Visualized portion unremarkable. Spleen: Size and appearance within normal limits. Right Kidney: Length: 11.3 cm. Echogenicity within normal limits. No mass or hydronephrosis visualized. Left Kidney: Length: 10.8 cm. Echogenicity within normal limits. No mass or hydronephrosis visualized. Abdominal aorta: No aneurysm visualized. Other findings: None. IMPRESSION: Normal examination. Electronically Signed   By: Lucienne Capers M.D.   On: 03/29/2016 23:15   Ct Abdomen  Pelvis W Contrast  03/28/2016  CLINICAL DATA:  30 year old female complaining of severe abdominal pain cramping. Diarrhea and nausea for the past 3 weeks. History of ulcerative colitis. EXAM: CT ABDOMEN AND PELVIS WITH CONTRAST TECHNIQUE:  Multidetector CT imaging of the abdomen and pelvis was performed using the standard protocol following bolus administration of intravenous contrast. CONTRAST:  156m ISOVUE-300 IOPAMIDOL (ISOVUE-300) INJECTION 61% COMPARISON:  No priors. FINDINGS: Lower chest:  Unremarkable. Hepatobiliary: No cystic or solid hepatic lesions. No intra or extrahepatic biliary ductal dilatation. Gallbladder is remarkable for some amorphous intermediate attenuation material lying dependently, which may represent noncalcified gallstones and/or biliary sludge. No signs to suggest an acute cholecystitis at this time. Pancreas: No pancreatic mass. No pancreatic ductal dilatation. No pancreatic or peripancreatic fluid or inflammatory changes. Spleen: Unremarkable. Adrenals/Urinary Tract: Bilateral adrenal glands and bilateral kidneys are normal in appearance. Enhancing soft tissue lesion adjacent to the upper pole the left kidney, which on some images may arise from the upper pole the left kidney. This lesion measures 1.2 x 1.4 x 1.4 cm (axial image 21 of series 2 and coronal image 95 of series 5). Right kidney is normal in appearance. No hydroureteronephrosis. Bilateral adrenal glands are normal in appearance. Stomach/Bowel: Normal appearance of the stomach. No pathologic dilatation of small bowel or colon. Extending from the proximal descending colon through the distal rectum there is subjectively increased mucosal enhancement, and slight hypervascularity in the associated mesocolon/mesorectum. No overt wall thickening or inflammatory changes otherwise noted to suggest acute flare of patient's ulcerative colitis. Normal appendix. Vascular/Lymphatic: No significant atherosclerotic disease, aneurysm or  dissection identified in the abdominal or pelvic vasculature. Multiple prominent but non pathologically enlarged retroperitoneal lymph nodes are noted (nonspecific). 8 mm short axis mesorectal lymph node (image 71 of series 2) is noted, but nonspecific. No pathologically enlarged lymph nodes are identified in the abdomen or pelvis. Reproductive: Retroverted uterus. Ovaries are unremarkable in appearance. Other: No significant volume of ascites.  No pneumoperitoneum. Musculoskeletal: There are no aggressive appearing lytic or blastic lesions noted in the visualized portions of the skeleton. IMPRESSION: 1. Biliary sludge and/or noncalcified gallstones lying dependently in the gallbladder. No findings to suggest acute cholecystitis at this time. If there is clinical concern for acute cholecystitis, further evaluation with right upper quadrant ultrasound could provide additional diagnostic information. 2. Enhancing lesion in the upper left retroperitoneum which is strongly favored to represent a splenule. However, the possibility of an exophytic solid renal lesion extending off the upper pole the left kidney cannot be excluded on the basis of today's examination (as the intervening fat plane between the lesion in the kidney is obscured on several axial, sagittal and coronal images). Accordingly, further evaluation with nonemergent MRI of the abdomen with and without IV gadolinium is recommended in the near future. 3. Mild hyperenhancement of the mucosa extending from the proximal descending colon through the distal rectum, and hypervascularity in the associated mesocolon/mesorectum. These findings are compatible with the reported clinical history of ulcerative colitis. No other overt findings to suggest acute colonic inflammation at this time. These results will be called to the ordering clinician or representative by the Radiologist Assistant, and communication documented in the PACS or zVision Dashboard.  Electronically Signed   By: DVinnie LangtonM.D.   On: 03/28/2016 15:09    Review of Systems  Constitutional: Negative for fever and chills.  HENT: Negative for sore throat and tinnitus.   Eyes: Negative for blurred vision and redness.  Respiratory: Negative for cough and shortness of breath.   Cardiovascular: Negative for chest pain, palpitations, orthopnea and PND.  Gastrointestinal: Positive for diarrhea. Negative for nausea, vomiting and abdominal pain.  Genitourinary: Negative for dysuria, urgency and frequency.  Musculoskeletal: Negative  for myalgias and joint pain.  Skin: Negative for rash.       No lesions  Neurological: Negative for speech change, focal weakness and weakness.  Endo/Heme/Allergies: Does not bruise/bleed easily.       No temperature intolerance  Psychiatric/Behavioral: Negative for depression and suicidal ideas.    Blood pressure 102/62, pulse 53, temperature 97.8 F (36.6 C), temperature source Oral, resp. rate 20, height _0  (1.753 m), weight 80.797 kg (178 lb 2 oz), last menstrual period 03/28/2016, SpO2 100 %. Physical Exam  Vitals reviewed. Constitutional: She is oriented to person, place, and time. She appears well-developed and well-nourished. No distress.  HENT:  Head: Normocephalic and atraumatic.  Mouth/Throat: Oropharynx is clear and moist.  Eyes: Conjunctivae and EOM are normal. Pupils are equal, round, and reactive to light. No scleral icterus.  Neck: Normal range of motion. Neck supple. No JVD present. No tracheal deviation present. No thyromegaly present.  Cardiovascular: Normal rate, regular rhythm and normal heart sounds.  Exam reveals no gallop and no friction rub.   No murmur heard. Respiratory: Effort normal and breath sounds normal. No respiratory distress.  GI: Soft. Bowel sounds are normal. She exhibits no distension. There is no tenderness.  Genitourinary:  Deferred  Musculoskeletal: Normal range of motion. She exhibits no edema.   Lymphadenopathy:    She has no cervical adenopathy.  Neurological: She is alert and oriented to person, place, and time. No cranial nerve deficit. She exhibits normal muscle tone.  Skin: Skin is warm and dry. No rash noted. No erythema.  Psychiatric: She has a normal mood and affect. Her behavior is normal. Judgment and thought content normal.     Assessment/Plan This is a 30 year old female with ulcerative colitis who is admitted for pancreatitis and urinary tract infection. 1. Pancreatitis: Ultrasound shows no dilatation of common bile duct. We'll place the patient on clear liquid diet and hydrate aggressively with intravenous fluid. 2. Urinary tract infection: Present on admission; the patient is asymptomatic. However it is important to note that she had urinary tract infection on a previous episode of pancreatitis. For this reason I will treat empirically with Rocephin. 3. Ulcerative colitis: Stable, no blood in the stool. Continue Asacol. 4. Depression: Continue Zoloft 5. DVT prophylaxis: Lovenox 6. GI prophylaxis: None The patient is a full code. Time spent on admission orders and patient care approximately 45 minutes  Harrie Foreman, MD 03/30/2016, 7:41 AM

## 2016-03-30 NOTE — ED Notes (Signed)
Admitting MD at bedside.

## 2016-03-30 NOTE — Progress Notes (Signed)
[place] [facility] [place] NAME: Jo Perkins    MR#:  [id number]  DATE OF BIRTH:  1986-02-13  SUBJECTIVE:  CHIEF COMPLAINT:   Chief Complaint  Patient presents with  . Abdominal Pain    Review of Systems  Constitutional: Negative for fever, chills and weight loss.  HENT: Negative for congestion.   Eyes: Negative for blurred vision and double vision.  Respiratory: Negative for cough, sputum production, shortness of breath and wheezing.   Cardiovascular: Negative for chest pain, palpitations, orthopnea, leg swelling and PND.  Gastrointestinal: Positive for nausea, abdominal pain and blood in stool. Negative for vomiting, diarrhea and constipation.  Genitourinary: Negative for dysuria, urgency, frequency and hematuria.  Musculoskeletal: Negative for falls.  Neurological: Negative for dizziness, tremors, focal weakness and headaches.  Endo/Heme/Allergies: Does not bruise/bleed easily.  Psychiatric/Behavioral: Negative for depression. The patient does not have insomnia.     VITAL SIGNS: Blood pressure 102/62, pulse 53, temperature 97.8 F (36.6 C), temperature source Oral, resp. rate 20, height 5' 9"  (1.753 m), weight 80.797 kg (178 lb 2 oz), last menstrual period 03/28/2016, SpO2 100 %.  PHYSICAL EXAMINATION:   GENERAL:  30 y.o.-year-old patient lying in the bed with no acute distress.  EYES: Pupils equal, round, reactive to light and accommodation. No scleral icterus. Extraocular muscles intact.  HEENT: Head atraumatic, normocephalic. Oropharynx and nasopharynx clear.  NECK:  Supple, no jugular venous distention. No thyroid enlargement, no tenderness.  LUNGS: Normal breath sounds bilaterally, no wheezing, rales,rhonchi or crepitation. No use of accessory muscles of respiration.  CARDIOVASCULAR: S1, S2 normal. No murmurs, rubs, or gallops.  ABDOMEN: Soft, early uncomfortable diffusely, mostly in upper abdomen but no rebound or guarding  noted, nondistended. Bowel sounds present. No organomegaly or mass.  EXTREMITIES: No pedal edema, cyanosis, or clubbing.  NEUROLOGIC: Cranial nerves II through XII are intact. Muscle strength 5/5 in all extremities. Sensation intact. Gait not checked.  PSYCHIATRIC: The patient is alert and oriented x 3.  SKIN: No obvious rash, lesion, or ulcer.   ORDERS/RESULTS REVIEWED:   CBC  Recent Labs Lab 03/29/16 2009  WBC 9.3  HGB 12.5  HCT 36.9  PLT 270  MCV 84.2  MCH 28.4  MCHC 33.7  RDW 14.2   ------------------------------------------------------------------------------------------------------------------  Chemistries   Recent Labs Lab 03/29/16 2009  NA 138  K 4.0  CL 106  CO2 26  GLUCOSE 116*  BUN 9  CREATININE 0.79  CALCIUM 9.4  AST 21  ALT 12*  ALKPHOS 63  BILITOT 0.3   ------------------------------------------------------------------------------------------------------------------ estimated creatinine clearance is 117.9 mL/min (by C-G formula based on Cr of 0.79). ------------------------------------------------------------------------------------------------------------------  Recent Labs  03/29/16 2009  TSH 3.928    Cardiac Enzymes No results for input(s): CKMB, TROPONINI, MYOGLOBIN in the last 168 hours.  Invalid input(s): CK ------------------------------------------------------------------------------------------------------------------ Invalid input(s): POCBNP ---------------------------------------------------------------------------------------------------------------  RADIOLOGY: US Abdomen Complete  03/29/2016  CLINICAL DATA:  Elevated lipase. Abdominal pain for 6 days. History of ulcerative colitis. EXAM: ABDOMEN ULTRASOUND COMPLETE COMPARISON:  CT abdomen and pelvis 03/28/2016 FINDINGS: Gallbladder: No gallstones or wall thickening visualized. No sonographic Murphy sign noted by sonographer. Common bile duct: Diameter: 3.5 mm, normal Liver: No  focal lesion identified. Within normal limits in parenchymal echogenicity. IVC: No abnormality visualized. Pancreas: Visualized portion unremarkable. Spleen: Size and appearance within normal limits. Right Kidney: Length: 11.3 cm. Echogenicity within normal limits. No mass or hydronephrosis visualized. Left Kidney: Length: 10.8 cm. Echogenicity within normal limits. No mass or hydronephrosis  visualized. Abdominal aorta: No aneurysm visualized. Other findings: None. IMPRESSION: Normal examination. Electronically Signed   By: Lucienne Capers M.D.   On: 03/29/2016 23:15   Ct Abdomen Pelvis W Contrast  03/28/2016  CLINICAL DATA:  30 year old female complaining of severe abdominal pain cramping. Diarrhea and nausea for the past 3 weeks. History of ulcerative colitis. EXAM: CT ABDOMEN AND PELVIS WITH CONTRAST TECHNIQUE: Multidetector CT imaging of the abdomen and pelvis was performed using the standard protocol following bolus administration of intravenous contrast. CONTRAST:  150m ISOVUE-300 IOPAMIDOL (ISOVUE-300) INJECTION 61% COMPARISON:  No priors. FINDINGS: Lower chest:  Unremarkable. Hepatobiliary: No cystic or solid hepatic lesions. No intra or extrahepatic biliary ductal dilatation. Gallbladder is remarkable for some amorphous intermediate attenuation material lying dependently, which may represent noncalcified gallstones and/or biliary sludge. No signs to suggest an acute cholecystitis at this time. Pancreas: No pancreatic mass. No pancreatic ductal dilatation. No pancreatic or peripancreatic fluid or inflammatory changes. Spleen: Unremarkable. Adrenals/Urinary Tract: Bilateral adrenal glands and bilateral kidneys are normal in appearance. Enhancing soft tissue lesion adjacent to the upper pole the left kidney, which on some images may arise from the upper pole the left kidney. This lesion measures 1.2 x 1.4 x 1.4 cm (axial image 21 of series 2 and coronal image 95 of series 5). Right kidney is normal in  appearance. No hydroureteronephrosis. Bilateral adrenal glands are normal in appearance. Stomach/Bowel: Normal appearance of the stomach. No pathologic dilatation of small bowel or colon. Extending from the proximal descending colon through the distal rectum there is subjectively increased mucosal enhancement, and slight hypervascularity in the associated mesocolon/mesorectum. No overt wall thickening or inflammatory changes otherwise noted to suggest acute flare of patient's ulcerative colitis. Normal appendix. Vascular/Lymphatic: No significant atherosclerotic disease, aneurysm or dissection identified in the abdominal or pelvic vasculature. Multiple prominent but non pathologically enlarged retroperitoneal lymph nodes are noted (nonspecific). 8 mm short axis mesorectal lymph node (image 71 of series 2) is noted, but nonspecific. No pathologically enlarged lymph nodes are identified in the abdomen or pelvis. Reproductive: Retroverted uterus. Ovaries are unremarkable in appearance. Other: No significant volume of ascites.  No pneumoperitoneum. Musculoskeletal: There are no aggressive appearing lytic or blastic lesions noted in the visualized portions of the skeleton. IMPRESSION: 1. Biliary sludge and/or noncalcified gallstones lying dependently in the gallbladder. No findings to suggest acute cholecystitis at this time. If there is clinical concern for acute cholecystitis, further evaluation with right upper quadrant ultrasound could provide additional diagnostic information. 2. Enhancing lesion in the upper left retroperitoneum which is strongly favored to represent a splenule. However, the possibility of an exophytic solid renal lesion extending off the upper pole the left kidney cannot be excluded on the basis of today's examination (as the intervening fat plane between the lesion in the kidney is obscured on several axial, sagittal and coronal images). Accordingly, further evaluation with nonemergent MRI of the  abdomen with and without IV gadolinium is recommended in the near future. 3. Mild hyperenhancement of the mucosa extending from the proximal descending colon through the distal rectum, and hypervascularity in the associated mesocolon/mesorectum. These findings are compatible with the reported clinical history of ulcerative colitis. No other overt findings to suggest acute colonic inflammation at this time. These results will be called to the ordering clinician or representative by the Radiologist Assistant, and communication documented in the PACS or zVision Dashboard. Electronically Signed   By: DVinnie LangtonM.D.   On: 03/28/2016 15:09    EKG: No orders  found for this or any previous visit.  ASSESSMENT AND PLAN:  Active Problems:   Pancreatitis #1. Acute pancreatitis, likely due to Imuran, ultrasound of right upper quadrant is unremarkable. Continue patient on IV fluids, pain medications, clear liquid diet, follow lipase in the morning, getting gastroenterologist Involved for Further Recommendations #2. Ulcerative colitis flare up with bloody stool, diarrhea, pain, initiate patient on budesonide orally, continue Asacol, gastroenterology consultation is requested #3 . Urinary tract infection, continue patient on Rocephin, cultures are pending #4. History of depression, well controlled on Zoloft #5. Hyperglycemia, hemoglobin A1c 5.2, no diabetes    Management plans discussed with the patient, family and they are in agreement.   DRUG ALLERGIES:  Allergies  Allergen Reactions  . Imuran  [Azathioprine]     caused pancreatitis    CODE STATUS:     Code Status Orders        Start     Ordered   03/30/16 0103  Full code   Continuous     03/30/16 0102    Code Status History    Date Active Date Inactive Code Status Order ID Comments User Context   This patient has a current code status but no historical code status.      TOTAL TIME TAKING CARE OF THIS PATIENT: 40 minutes.     Theodoro Grist M.D on 03/30/2016 at 12:01 PM  Between 7am to 6pm - Pager - [phone]  After 6pm go to [email or web address] - password [email or web address] [facility]  Office  [phone]  CC: Primary care physician; Lelon Huh, MD

## 2016-03-30 NOTE — Consult Note (Signed)
Jo Minium, MD [facility]  [address] [place], [place] [address] Phone: [phone] Fax : [phone]  Consultation  Referring Provider:     No ref. provider found Primary Care Physician:  Mila Merry, MD Primary Gastroenterologist:  Dr. Bluford Kaufmann         Reason for Consultation:     Pancreatitis  Date of Admission:  03/29/2016 Date of Consultation:  03/30/2016         HPI:   Jo Perkins is a 30 y.o. female who has a history of inflammatory bowel disease with ulcerative colitis per the patient reports that she was diagnosed in 2010. The patient states that she had been treated with Imuran in the past when she was first diagnosed and had pancreatitis from that. The patient has had no further interaction with Imuran since then. The patient has been on mesalamine at 800 mg 3 times a day for maintenance therapy. The patient states she was having some abdominal discomfort and went to see the office of her gastrologist. The patient's GI doctor, Dr. Bluford Kaufmann has left and she got to see the nurse practitioner. The person who she saw sent off stool samples and gave the patient a prescription for steroids. The patient never got called with the results so she did not start the steroids. She continued to have abdominal pain and came to the hospital. The patient was found to have a lipase elevated at 1300. The patient states that she had severe abdominal pain which has since resolved. The patient is now tolerating a clear liquid diet without any problems. The patient also denies any diarrhea or rectal bleeding. She states that her last bowel movement was 4 days ago. There is no report of any fevers chills nausea or vomiting. The patient has had multiple colonoscopies in the past including at diagnosis and subsequently again by Dr. Mechele Collin and by Dr. Bluford Kaufmann. She has never been treated with immunologic's and only tried Imuran which she reacted to and the mesalamine which she is presently on.  Past Medical History    Diagnosis Date  . Panic attack   . History of colon polyps   . Ulcerative colitis (HCC)   . Depression     Past Surgical History  Procedure Laterality Date  . None      Prior to Admission medications   Medication Sig Start Date End Date Taking? Authorizing Provider  ciprofloxacin (CIPRO) 500 MG tablet Take 500 mg by mouth 2 (two) times daily.   Yes Historical Provider, MD  clonazePAM (KLONOPIN) 0.5 MG tablet 1/2-1 tablet every four hours as needed. Patient taking differently: Take 0.5 mg by mouth 2 (two) times daily. 0.5 mg in the am and .25-0.5 mg as needed 02/08/16  Yes Malva Limes, MD  dicyclomine (BENTYL) 10 MG capsule Take 10 mg by mouth 4 (four) times daily.   Yes Historical Provider, MD  Mesalamine (ASACOL) 400 MG CPDR DR capsule Take 800 mg by mouth 3 (three) times daily.    Yes Historical Provider, MD  metroNIDAZOLE (FLAGYL) 250 MG tablet Take 250 mg by mouth 4 (four) times daily.   Yes Historical Provider, MD  sertraline (ZOLOFT) 100 MG tablet TAKE 1 TABLET BY MOUTH EVERY DAY 03/05/16  Yes Malva Limes, MD    Family History  Problem Relation Age of Onset  . Diabetes Maternal Grandmother     type 2  . Stroke Maternal Grandmother   . Heart attack Maternal Grandmother   . Lung  cancer Maternal Grandfather      Social History  Substance Use Topics  . Smoking status: Never Smoker   . Smokeless tobacco: None  . Alcohol Use: 0.0 oz/week    0 Standard drinks or equivalent per week     Comment: rare    Allergies as of 03/29/2016 - Review Complete 03/29/2016  Allergen Reaction Noted  . Imuran  [azathioprine]  03/16/2015    Review of Systems:    All systems reviewed and negative except where noted in HPI.   Physical Exam:  Vital signs in last 24 hours: Temp:  [97.8 F (36.6 C)-98.4 F (36.9 C)] 98.1 F (36.7 C) (07/14 1330) Pulse Rate:  [46-63] 55 (07/14 1330) Resp:  [16-20] 19 (07/14 1330) BP: (102-114)/(62-76) 107/68 mmHg (07/14 1330) SpO2:  [98 %-100  %] 98 % (07/14 1330) Weight:  [177 lb (80.287 kg)-178 lb 2 oz (80.797 kg)] 178 lb 2 oz (80.797 kg) (07/14 0100) Last BM Date: 03/28/16 General:   Pleasant, cooperative in NAD Head:  Normocephalic and atraumatic. Eyes:   No icterus.   Conjunctiva pink. PERRLA. Ears:  Normal auditory acuity. Neck:  Supple; no masses or thyroidomegaly Lungs: Respirations even and unlabored. Lungs clear to auscultation bilaterally.   No wheezes, crackles, or rhonchi.  Heart:  Regular rate and rhythm;  Without murmur, clicks, rubs or gallops Abdomen:  Soft, nondistended, nontender. Normal bowel sounds. No appreciable masses or hepatomegaly.  No rebound or guarding.  Rectal:  Not performed. Msk:  Symmetrical without gross deformities.    Extremities:  Without edema, cyanosis or clubbing. Neurologic:  Alert and oriented x3;  grossly normal neurologically. Skin:  Intact without significant lesions or rashes. Cervical Nodes:  No significant cervical adenopathy. Psych:  Alert and cooperative. Normal affect.  LAB RESULTS:  Recent Labs  03/29/16 2009  WBC 9.3  HGB 12.5  HCT 36.9  PLT 270   BMET  Recent Labs  03/29/16 2009  NA 138  K 4.0  CL 106  CO2 26  GLUCOSE 116*  BUN 9  CREATININE 0.79  CALCIUM 9.4   LFT  Recent Labs  03/29/16 2009  PROT 7.5  ALBUMIN 4.1  AST 21  ALT 12*  ALKPHOS 63  BILITOT 0.3   PT/INR No results for input(s): LABPROT, INR in the last 72 hours.  STUDIES: US Abdomen Complete  03/29/2016  CLINICAL DATA:  Elevated lipase. Abdominal pain for 6 days. History of ulcerative colitis. EXAM: ABDOMEN ULTRASOUND COMPLETE COMPARISON:  CT abdomen and pelvis 03/28/2016 FINDINGS: Gallbladder: No gallstones or wall thickening visualized. No sonographic Murphy sign noted by sonographer. Common bile duct: Diameter: 3.5 mm, normal Liver: No focal lesion identified. Within normal limits in parenchymal echogenicity. IVC: No abnormality visualized. Pancreas: Visualized portion  unremarkable. Spleen: Size and appearance within normal limits. Right Kidney: Length: 11.3 cm. Echogenicity within normal limits. No mass or hydronephrosis visualized. Left Kidney: Length: 10.8 cm. Echogenicity within normal limits. No mass or hydronephrosis visualized. Abdominal aorta: No aneurysm visualized. Other findings: None. IMPRESSION: Normal examination. Electronically Signed   By: Burman Nieves M.D.   On: 03/29/2016 23:15      Impression / Plan:   KINSLEIGH LUDOLPH is a 30 y.o. y/o female with Who has a history of ulcerative colitis which she reports no diarrhea or bloody stools at the present time. The patient had a bowel movement 3 days ago and nothing since. The patient was found to have pancreatitis but did not have any lipase checked this morning.  Her level yesterday was 1300. 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 The patient will have her blood sent off for CRP to look for possible increased related to a flare of her IBD despite not having diarrhea. The patient's abdominal pain is much better and I will check a lipase level for tomorrow. The patient has been explained the need for colonoscopies after having the disease for 8 years which she was unaware of. She was told that she needs colonoscopies every one to 2 years after 8 years of having the disease. The patient has also been informed about the possible future treatments including immunological therapy with escalating doses of her mesalamine and possible use of steroids if her symptoms return. I do not believe starting steroids at this time is advisable. The patient will also have her blood checked for IgG4 for possible autoimmune pancreatitis although this is less likely than possibly a passed gallstone. The patient has been explained the plan and agrees with it.   Thank you for involving me in the care of this patient.      LOS: 0 days   Jo Miniumarren Tannah Dreyfuss, MD  03/30/2016, 4:02  PM   Note: This dictation was prepared with Dragon dictation along with smaller phrase technology. Any transcriptional errors that result from this process are unintentional.

## 2016-03-30 NOTE — Care Management (Signed)
Admitted to [facility] with the diagnosis of pancreatitis. Lives with parents. Mother is Misty StanleyLisa [phone]). Works at [employer]. Takes care of all basic and instrumental activities of daily living herself, drives. Last seen Nurse Practioner at Dr. Reyes IvanSkulskie's office 03/28/16. Last seen Dr. Sherrie MustacheFisher late May. Mother will transport. No follow-up needs identified. Tolerating clear liquid diet. Mother at the bedside.

## 2016-03-31 LAB — C-REACTIVE PROTEIN: CRP: 1 mg/dL — ABNORMAL HIGH (ref <1.0)

## 2016-03-31 LAB — LIPASE, BLOOD: LIPASE: 357 U/L — AB (ref 11–51)

## 2016-03-31 MED ORDER — CIPROFLOXACIN HCL 500 MG PO TABS: 500.0000 mg | ORAL_TABLET | Freq: Two times a day (BID) | ORAL | Status: DC

## 2016-03-31 NOTE — Progress Notes (Signed)
Pt's mother present; pt discharged via wheelchair by nursing to the visitor's entrance

## 2016-03-31 NOTE — Progress Notes (Signed)
MD order received to discharge pt home today; verbally reviewed AVS with pt including medications/gave Rx to pt; diet/heart healthy and low salt; follow up appointments/pt to call Dr Sherrie MustacheFisher and Dr Annabell SabalWohl's offices on 04/02/16 to schedule appointments; no further questions voiced at this time; pt's discharge pending arrival of her mother with her clothes

## 2016-03-31 NOTE — Discharge Instructions (Signed)
Low fat diet. Activity as tolerated.

## 2016-03-31 NOTE — Discharge Summary (Signed)
[place] [facility] [place] NAME: Jo Perkins    MR#:  [id number]  DATE OF BIRTH:  [birth year]  DATE OF ADMISSION:  03/29/2016 ADMITTING PHYSICIAN: Harrie Foreman, MD  DATE OF DISCHARGE: 03/31/2016 PRIMARY CARE PHYSICIAN: Lelon Huh, MD    ADMISSION DIAGNOSIS:  Hx of gallstones [Z87.19] Elevated lipase [R74.8] Acute pancreatitis, unspecified pancreatitis type [K85.9]  DISCHARGE DIAGNOSIS:  Active Problems:   Pancreatitis   Pancreatitis, acute   Elevated lipase Ulcerative colitis flare  SECONDARY DIAGNOSIS:   Past Medical History  Diagnosis Date  . Panic attack   . History of colon polyps   . Ulcerative colitis ([place])   . Depression     HOSPITAL COURSE:   #1. Acute pancreatitis, likely due to Imuran, ultrasound of right upper quadrant is unremarkable. She was on IV fluids, pain medications, clear liquid diet, lipase is improving. She tolerated full liquid diet, advance to soft diet.  #2. Ulcerative colitis flare up with bloody stool, diarrhea, pain, initiated patient on budesonide orally, continue Asacol. Per Dr. Allen Norris, gastroenterology consultation, no steroid this time.  #3 . Urinary tract infection,  patient is on Rocephin, change to cipro after discharge. #4. History of depression, well controlled on Zoloft #5. Hyperglycemia, hemoglobin A1c 5.2, no diabetes  DISCHARGE CONDITIONS:   Stable, discharge to home today.  CONSULTS OBTAINED:  Treatment Team:  Lucilla Lame, MD  DRUG ALLERGIES:   Allergies  Allergen Reactions  . Imuran  [Azathioprine]     caused pancreatitis    DISCHARGE MEDICATIONS:   Current Discharge Medication List    CONTINUE these medications which have NOT CHANGED   Details  ciprofloxacin (CIPRO) 500 MG tablet Take 500 mg by mouth 2 (two) times daily.    clonazePAM (KLONOPIN) 0.5 MG tablet 1/2-1 tablet every four hours as needed. Qty: 30 tablet, Refills: 1    dicyclomine (BENTYL)  10 MG capsule Take 10 mg by mouth 4 (four) times daily.    Mesalamine (ASACOL) 400 MG CPDR DR capsule Take 800 mg by mouth 3 (three) times daily.     sertraline (ZOLOFT) 100 MG tablet TAKE 1 TABLET BY MOUTH EVERY DAY Qty: 30 tablet, Refills: 11      STOP taking these medications     metroNIDAZOLE (FLAGYL) 250 MG tablet          DISCHARGE INSTRUCTIONS:    DIET:  Regular diet.  DISCHARGE CONDITION:  Stable, discharge to home today.  ACTIVITY:  As tolerated.  OXYGEN:  Home Oxygen: no   Oxygen Delivery: no  DISCHARGE LOCATION:     If you experience worsening of your admission symptoms, develop shortness of breath, life threatening emergency, suicidal or homicidal thoughts you must seek medical attention immediately by calling 911 or calling your MD immediately  if symptoms less severe.  You Must read complete instructions/literature along with all the possible adverse reactions/side effects for all the Medicines you take and that have been prescribed to you. Take any new Medicines after you have completely understood and accpet all the possible adverse reactions/side effects.   Please note  You were cared for by a hospitalist during your hospital stay. If you have any questions about your discharge medications or the care you received while you were in the hospital after you are discharged, you can call the unit and asked to speak with the hospitalist on call if the hospitalist that took care of you is not available. Once you are discharged,  your primary care physician will handle any further medical issues. Please note that NO REFILLS for any discharge medications will be authorized once you are discharged, as it is imperative that you return to your primary care physician (or establish a relationship with a primary care physician if you do not have one) for your aftercare needs so that they can reassess your need for medications and monitor your lab values.    On the day  of Discharge:  VITAL SIGNS:  Blood pressure 108/64, pulse 53, temperature 98.3 F (36.8 C), temperature source Oral, resp. rate 16, height 5' 9"  (1.753 m), weight 177 lb 14.4 oz (80.695 kg), last menstrual period 03/28/2016, SpO2 99 %.  PHYSICAL EXAMINATION:  GENERAL:  30 y.o.-year-old patient lying in the bed with no acute distress.  EYES: Pupils equal, round, reactive to light and accommodation. No scleral icterus. Extraocular muscles intact.  HEENT: Head atraumatic, normocephalic. Oropharynx and nasopharynx clear.  NECK:  Supple, no jugular venous distention. No thyroid enlargement, no tenderness.  LUNGS: Normal breath sounds bilaterally, no wheezing, rales,rhonchi or crepitation. No use of accessory muscles of respiration.  CARDIOVASCULAR: S1, S2 normal. No murmurs, rubs, or gallops.  ABDOMEN: Soft, non-tender, non-distended. Bowel sounds present. No organomegaly or mass.  EXTREMITIES: No pedal edema, cyanosis, or clubbing.  NEUROLOGIC: Cranial nerves II through XII are intact. Muscle strength 5/5 in all extremities. Sensation intact. Gait not checked.  PSYCHIATRIC: The patient is alert and oriented x 3.  SKIN: No obvious rash, lesion, or ulcer.  DATA REVIEW:   CBC  Recent Labs Lab 03/29/16 2009  WBC 9.3  HGB 12.5  HCT 36.9  PLT 270    Chemistries   Recent Labs Lab 03/29/16 2009  NA 138  K 4.0  CL 106  CO2 26  GLUCOSE 116*  BUN 9  CREATININE 0.79  CALCIUM 9.4  AST 21  ALT 12*  ALKPHOS 63  BILITOT 0.3    Cardiac Enzymes No results for input(s): TROPONINI in the last 168 hours.  Microbiology Results  Results for orders placed or performed during the hospital encounter of 03/29/16  Gastrointestinal Panel by PCR , Stool     Status: None   Collection Time: 03/29/16  9:00 AM  Result Value Ref Range Status   Campylobacter species NOT DETECTED NOT DETECTED Final   Plesimonas shigelloides NOT DETECTED NOT DETECTED Final   Salmonella species NOT DETECTED NOT  DETECTED Final   Yersinia enterocolitica NOT DETECTED NOT DETECTED Final   Vibrio species NOT DETECTED NOT DETECTED Final   Vibrio cholerae NOT DETECTED NOT DETECTED Final   Enteroaggregative E coli (EAEC) NOT DETECTED NOT DETECTED Final   Enteropathogenic E coli (EPEC) NOT DETECTED NOT DETECTED Final   Enterotoxigenic E coli (ETEC) NOT DETECTED NOT DETECTED Final   Shiga like toxin producing E coli (STEC) NOT DETECTED NOT DETECTED Final   E. coli O157 NOT DETECTED NOT DETECTED Final   Shigella/Enteroinvasive E coli (EIEC) NOT DETECTED NOT DETECTED Final   Cryptosporidium NOT DETECTED NOT DETECTED Final   Cyclospora cayetanensis NOT DETECTED NOT DETECTED Final   Entamoeba histolytica NOT DETECTED NOT DETECTED Final   Giardia lamblia NOT DETECTED NOT DETECTED Final   Adenovirus F40/41 NOT DETECTED NOT DETECTED Final   Astrovirus NOT DETECTED NOT DETECTED Final   Norovirus GI/GII NOT DETECTED NOT DETECTED Final   Rotavirus A NOT DETECTED NOT DETECTED Final   Sapovirus (I, II, IV, and V) NOT DETECTED NOT DETECTED Final  C difficile quick scan  w PCR reflex     Status: None   Collection Time: 03/29/16  9:00 AM  Result Value Ref Range Status   C Diff antigen NEGATIVE NEGATIVE Final   C Diff toxin NEGATIVE NEGATIVE Final   C Diff interpretation No C. difficile detected.  Final    RADIOLOGY:  US Abdomen Complete  03/29/2016  CLINICAL DATA:  Elevated lipase. Abdominal pain for 6 days. History of ulcerative colitis. EXAM: ABDOMEN ULTRASOUND COMPLETE COMPARISON:  CT abdomen and pelvis 03/28/2016 FINDINGS: Gallbladder: No gallstones or wall thickening visualized. No sonographic Murphy sign noted by sonographer. Common bile duct: Diameter: 3.5 mm, normal Liver: No focal lesion identified. Within normal limits in parenchymal echogenicity. IVC: No abnormality visualized. Pancreas: Visualized portion unremarkable. Spleen: Size and appearance within normal limits. Right Kidney: Length: 11.3 cm.  Echogenicity within normal limits. No mass or hydronephrosis visualized. Left Kidney: Length: 10.8 cm. Echogenicity within normal limits. No mass or hydronephrosis visualized. Abdominal aorta: No aneurysm visualized. Other findings: None. IMPRESSION: Normal examination. Electronically Signed   By: Lucienne Capers M.D.   On: 03/29/2016 23:15     Management plans discussed with the patient, her parents and they are in agreement.  CODE STATUS:     Code Status Orders        Start     Ordered   03/30/16 0103  Full code   Continuous     03/30/16 0102    Code Status History    Date Active Date Inactive Code Status Order ID Comments User Context   This patient has a current code status but no historical code status.      TOTAL TIME TAKING CARE OF THIS PATIENT: 35 minutes.    Demetrios Loll M.D on 03/31/2016 at 12:25 PM  Between 7am to 6pm - Pager - [phone]  After 6pm go to [email or web address] - password [email or web address] [facility]  Office  [phone]  CC: Primary care physician; Lelon Huh, MD   Note: This dictation was prepared with Dragon dictation along with smaller phrase technology. Any transcriptional errors that result from this process are unintentional.

## 2016-04-01 LAB — URINE CULTURE

## 2016-04-02 LAB — IGG 4: IgG, Subclass 4: 53 mg/dL (ref 2–96)

## 2016-04-03 ENCOUNTER — Other Ambulatory Visit: Payer: Self-pay

## 2016-04-03 ENCOUNTER — Encounter: Payer: Self-pay | Admitting: Emergency Medicine

## 2016-04-03 ENCOUNTER — Telehealth: Payer: Self-pay | Admitting: Gastroenterology

## 2016-04-03 ENCOUNTER — Emergency Department
Admission: EM | Admit: 2016-04-03 | Discharge: 2016-04-03 | Disposition: A | Discharge status: Home / Self Care | Payer: BC Managed Care – PPO | Attending: Emergency Medicine | Admitting: Emergency Medicine

## 2016-04-03 DIAGNOSIS — R100 Acute abdomen: Secondary | ICD-10-CM | POA: Diagnosis not present

## 2016-04-03 DIAGNOSIS — R109 Unspecified abdominal pain: Secondary | ICD-10-CM

## 2016-04-03 DIAGNOSIS — N390 Urinary tract infection, site not specified: Secondary | ICD-10-CM

## 2016-04-03 DIAGNOSIS — Z79899 Other long term (current) drug therapy: Secondary | ICD-10-CM | POA: Insufficient documentation

## 2016-04-03 DIAGNOSIS — R1084 Generalized abdominal pain: Secondary | ICD-10-CM

## 2016-04-03 DIAGNOSIS — F329 Major depressive disorder, single episode, unspecified: Secondary | ICD-10-CM | POA: Insufficient documentation

## 2016-04-03 DIAGNOSIS — Z8719 Personal history of other diseases of the digestive system: Secondary | ICD-10-CM

## 2016-04-03 LAB — COMPREHENSIVE METABOLIC PANEL
ALK PHOS: 49 U/L (ref 38–126)
ALT: 12 U/L — AB (ref 14–54)
AST: 21 U/L (ref 15–41)
Albumin: 3.7 g/dL (ref 3.5–5.0)
Anion gap: 6 (ref 5–15)
BILIRUBIN TOTAL: 0.5 mg/dL (ref 0.3–1.2)
BUN: 11 mg/dL (ref 6–20)
CALCIUM: 9 mg/dL (ref 8.9–10.3)
CO2: 24 mmol/L (ref 22–32)
CREATININE: 0.67 mg/dL (ref 0.44–1.00)
Chloride: 107 mmol/L (ref 101–111)
Glucose, Bld: 99 mg/dL (ref 65–99)
Potassium: 3.4 mmol/L — ABNORMAL LOW (ref 3.5–5.1)
Sodium: 137 mmol/L (ref 135–145)
TOTAL PROTEIN: 6.9 g/dL (ref 6.5–8.1)

## 2016-04-03 LAB — URINALYSIS COMPLETE WITH MICROSCOPIC (ARMC ONLY)
BILIRUBIN URINE: NEGATIVE
Glucose, UA: NEGATIVE mg/dL
Hgb urine dipstick: NEGATIVE
Ketones, ur: NEGATIVE mg/dL
Nitrite: NEGATIVE
PH: 6 (ref 5.0–8.0)
PROTEIN: NEGATIVE mg/dL
Specific Gravity, Urine: 1.012 (ref 1.005–1.030)

## 2016-04-03 LAB — CBC
HEMATOCRIT: 33.6 % — AB (ref 35.0–47.0)
HEMOGLOBIN: 11.6 g/dL — AB (ref 12.0–16.0)
MCH: 28.7 pg (ref 26.0–34.0)
MCHC: 34.6 g/dL (ref 32.0–36.0)
MCV: 83.1 fL (ref 80.0–100.0)
Platelets: 269 10*3/uL (ref 150–440)
RBC: 4.04 MIL/uL (ref 3.80–5.20)
RDW: 14 % (ref 11.5–14.5)
WBC: 7.1 10*3/uL (ref 3.6–11.0)

## 2016-04-03 LAB — LIPASE, BLOOD: LIPASE: 177 U/L — AB (ref 11–51)

## 2016-04-03 LAB — AMYLASE: Amylase: 96 U/L (ref 28–100)

## 2016-04-03 MED ORDER — NITROFURANTOIN MONOHYD MACRO 100 MG PO CAPS
100.0000 mg | ORAL_CAPSULE | Freq: Once | ORAL | Status: AC
  Administered 2016-04-03: 100 mg via ORAL
  Filled 2016-04-03: qty 1

## 2016-04-03 MED ORDER — ONDANSETRON HCL 4 MG/2ML IJ SOLN
4.0000 mg | Freq: Once | INTRAMUSCULAR | Status: AC
  Administered 2016-04-03: 4 mg via INTRAVENOUS
  Filled 2016-04-03: qty 2

## 2016-04-03 MED ORDER — ONDANSETRON 4 MG PO TBDP
4.0000 mg | ORAL_TABLET | Freq: Once | ORAL | Status: AC
  Administered 2016-04-03: 4 mg via ORAL
  Filled 2016-04-03: qty 1

## 2016-04-03 MED ORDER — SODIUM CHLORIDE 0.9 % IV BOLUS (SEPSIS)
1000.0000 mL | Freq: Once | INTRAVENOUS | Status: AC
Start: 2016-04-03 — End: 2016-04-03
  Administered 2016-04-03: 1000 mL via INTRAVENOUS

## 2016-04-03 MED ORDER — MORPHINE SULFATE (PF) 4 MG/ML IV SOLN
4.0000 mg | Freq: Once | INTRAVENOUS | Status: AC
  Administered 2016-04-03: 4 mg via INTRAVENOUS
  Filled 2016-04-03: qty 1

## 2016-04-03 MED ORDER — NITROFURANTOIN MONOHYD MACRO 100 MG PO CAPS: 100.0000 mg | ORAL_CAPSULE | Freq: Two times a day (BID) | ORAL | Status: AC

## 2016-04-03 MED ORDER — HYDROCODONE-ACETAMINOPHEN 5-325 MG PO TABS
1.0000 | ORAL_TABLET | Freq: Once | ORAL | Status: AC
Start: 2016-04-03 — End: 2016-04-03
  Administered 2016-04-03: 1 via ORAL
  Filled 2016-04-03: qty 1

## 2016-04-03 MED ORDER — ONDANSETRON 4 MG PO TBDP: 4.0000 mg | ORAL_TABLET | Freq: Three times a day (TID) | ORAL | Status: DC | PRN

## 2016-04-03 MED ORDER — HYDROCODONE-ACETAMINOPHEN 5-325 MG PO TABS: 1.0000 | ORAL_TABLET | ORAL | Status: DC | PRN

## 2016-04-03 NOTE — Telephone Encounter (Signed)
Lets order the HIDA.

## 2016-04-03 NOTE — Telephone Encounter (Signed)
Pt has been scheduled for a HIDA scan at [facility], [place] location on Thursday, July 27th at 11:00am. Pt is to arrive at 10:45am and to be NPO 6 hrs prior to scan. Pt is aware of date, time, location and prep instructions.

## 2016-04-03 NOTE — ED Provider Notes (Signed)
[facility] Emergency Department Provider Note  ___________________________________________  Time seen: Approximately 2:42 AM  I have reviewed the triage vital signs and the nursing notes.   HISTORY  Chief Complaint Abdominal Pain    HPI Jo Perkins is a 30 y.o. female white female who recently was discharged from the hospital from pancreatitis. Patient has long history of ulcerative colitis, and when she was admitted a week ago, she thought she was having a flare up of her ulcerative colitis, but was found to have pancreatitis. Patient states that her lipase was around 1300 on admission. Patient states that she was immediately tolerating by mouth fluids and then foods and was discharged home a couple of days later. She was initially getting better and then tonight the pain has gotten severe again in her midepigastric and left upper quadrant and she feels like the pancreatitis has reoccurred. Patient states she is only been taking Tylenol at home with no relief. Patient denies any fever, chills, or vomiting. Patient states that she's had intermittent loose and mucousy stools. Patient states her pain on scale of 0-10 is about a 9. Eating makes it worse and nothing seems to make it better.   Past Medical History  Diagnosis Date  . Panic attack   . History of colon polyps   . Ulcerative colitis (HCC)   . Depression     Patient Active Problem List   Diagnosis Date Noted  . Pancreatitis 03/30/2016  . Pancreatitis, acute   . Elevated lipase   . History of colon polyps 03/16/2015  . Panic 03/16/2015  . 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 None      Current Outpatient Rx  Name  Route  Sig  Dispense  Refill  . ciprofloxacin (CIPRO) 500 MG tablet   Oral   Take 1 tablet (500 mg total) by mouth 2 (two) times daily.   6 tablet   0   . clonazePAM (KLONOPIN) 0.5 MG tablet      1/2-1 tablet every four  hours as needed. Patient taking differently: Take 0.5 mg by mouth 2 (two) times daily. 0.5 mg in the am and .25-0.5 mg as needed   30 tablet   1   . dicyclomine (BENTYL) 10 MG capsule   Oral   Take 10 mg by mouth 4 (four) times daily.         . Mesalamine (ASACOL) 400 MG CPDR DR capsule   Oral   Take 800 mg by mouth 3 (three) times daily.          . sertraline (ZOLOFT) 100 MG tablet      TAKE 1 TABLET BY MOUTH EVERY DAY   30 tablet   11   . HYDROcodone-acetaminophen (NORCO) 5-325 MG tablet   Oral   Take 1 tablet by mouth every 4 (four) hours as needed for moderate pain.   20 tablet   0   . nitrofurantoin, macrocrystal-monohydrate, (MACROBID) 100 MG capsule   Oral   Take 1 capsule (100 mg total) by mouth 2 (two) times daily.   14 capsule   0   . ondansetron (ZOFRAN ODT) 4 MG disintegrating tablet   Oral   Take 1 tablet (4 mg total) by mouth every 8 (eight) hours as needed for nausea or vomiting.   12 tablet   0     Allergies Imuran   Family History  Problem Relation Age of  Onset  . Diabetes Maternal Grandmother     type 2  . Stroke Maternal Grandmother   . Heart attack Maternal Grandmother   . Lung cancer Maternal Grandfather     Social History Social History  Substance Use Topics  . Smoking status: Never Smoker   . Smokeless tobacco: None  . Alcohol Use: 0.0 oz/week    0 Standard drinks or equivalent per week     Comment: rare    Review of Systems Constitutional: No fever/chills Eyes: No visual changes. ENT: No sore throat. Cardiovascular: Denies chest pain. Respiratory: Denies shortness of breath. Gastrointestinal: Positive for abdominal pain primarily in her left upper quadrant.  Mild nausea, no vomiting.  Intermittent diarrhea.  No constipation. Genitourinary: Negative for dysuria. Musculoskeletal: Negative for back pain. Skin: Negative for rash. Neurological: Negative for headaches, focal weakness or numbness.  10-point ROS otherwise  negative.  ____________________________________________   PHYSICAL EXAM:  VITAL SIGNS: ED Triage Vitals  Enc Vitals Group     BP 04/03/16 0232 122/82 mmHg     Pulse Rate 04/03/16 0232 57     Resp 04/03/16 0232 18     Temp 04/03/16 0232 98 F (36.7 C)     Temp Source 04/03/16 0232 Oral     SpO2 04/03/16 0232 98 %     Weight 04/03/16 0232 177 lb (80.287 kg)     Height 04/03/16 0232  (1.753 m)     Head Cir --      Peak Flow --      Pain Score 04/03/16 0228 7     Pain Loc --      Pain Edu? --      Excl. in GC? --     Constitutional: Alert and oriented. Well appearing and in no acute distress. Eyes: Conjunctivae are normal. PERRL. EOMI. Head: Atraumatic. Nose: No congestion/rhinnorhea. Mouth/Throat: Mucous membranes are moist.  Oropharynx non-erythematous. Neck: No stridor.   Cardiovascular: Normal rate, regular rhythm. Grossly normal heart sounds.  Good peripheral circulation. Respiratory: Normal respiratory effort.  No retractions. Lungs CTAB. Gastrointestinal: Soft and nontender. No distention. No abdominal bruits. No CVA tenderness. Musculoskeletal: No lower extremity tenderness nor edema.  No joint effusions. Neurologic:  Normal speech and language. No gross focal neurologic deficits are appreciated. No gait instability. Skin:  Skin is warm, dry and intact. No rash noted. Psychiatric: Mood and affect are normal. Speech and behavior are normal.  ____________________________________________   LABS (all labs ordered are listed, but only abnormal results are displayed)  Labs Reviewed  CBC - Abnormal; Notable for the following:    Hemoglobin 11.6 (*)    HCT 33.6 (*)    All other components within normal limits  COMPREHENSIVE METABOLIC PANEL - Abnormal; Notable for the following:    Potassium 3.4 (*)    ALT 12 (*)    All other components within normal limits  LIPASE, BLOOD - Abnormal; Notable for the following:    Lipase 177 (*)    All other components within  normal limits  URINALYSIS COMPLETEWITH MICROSCOPIC ([facility] ONLY) - Abnormal; Notable for the following:    Color, Urine YELLOW (*)    APPearance HAZY (*)    Leukocytes, UA 2+ (*)    Bacteria, UA RARE (*)    Squamous Epithelial / LPF 6-30 (*)    All other components within normal limits  AMYLASE   ____________________________________________  EKG   ____________________________________________  RADIOLOGY  US Abdomen Complete  03/29/2016  CLINICAL DATA:  Elevated  lipase. Abdominal pain for 6 days. History of ulcerative colitis. EXAM: ABDOMEN ULTRASOUND COMPLETE COMPARISON:  CT abdomen and pelvis 03/28/2016 FINDINGS: Gallbladder: No gallstones or wall thickening visualized. No sonographic Murphy sign noted by sonographer. Common bile duct: Diameter: 3.5 mm, normal Liver: No focal lesion identified. Within normal limits in parenchymal echogenicity. IVC: No abnormality visualized. Pancreas: Visualized portion unremarkable. Spleen: Size and appearance within normal limits. Right Kidney: Length: 11.3 cm. Echogenicity within normal limits. No mass or hydronephrosis visualized. Left Kidney: Length: 10.8 cm. Echogenicity within normal limits. No mass or hydronephrosis visualized. Abdominal aorta: No aneurysm visualized. Other findings: None. IMPRESSION: Normal examination. Electronically Signed   By: Burman Nieves M.D.   On: 03/29/2016 23:15   Ct Abdomen Pelvis W Contrast  03/28/2016  CLINICAL DATA:  30 year old female complaining of severe abdominal pain cramping. Diarrhea and nausea for the past 3 weeks. History of ulcerative colitis. EXAM: CT ABDOMEN AND PELVIS WITH CONTRAST TECHNIQUE: Multidetector CT imaging of the abdomen and pelvis was performed using the standard protocol following bolus administration of intravenous contrast. CONTRAST:  ISOVUE-300 IOPAMIDOL (ISOVUE-300) INJECTION 61% COMPARISON:  No priors. FINDINGS: Lower chest:  Unremarkable. Hepatobiliary: No cystic or solid hepatic  lesions. No intra or extrahepatic biliary ductal dilatation. Gallbladder is remarkable for some amorphous intermediate attenuation material lying dependently, which may represent noncalcified gallstones and/or biliary sludge. No signs to suggest an acute cholecystitis at this time. Pancreas: No pancreatic mass. No pancreatic ductal dilatation. No pancreatic or peripancreatic fluid or inflammatory changes. Spleen: Unremarkable. Adrenals/Urinary Tract: Bilateral adrenal glands and bilateral kidneys are normal in appearance. Enhancing soft tissue lesion adjacent to the upper pole the left kidney, which on some images may arise from the upper pole the left kidney. This lesion measures 1.2 x 1.4 x 1.4 cm (axial image 21 of series 2 and coronal image 95 of series 5). Right kidney is normal in appearance. No hydroureteronephrosis. Bilateral adrenal glands are normal in appearance. Stomach/Bowel: Normal appearance of the stomach. No pathologic dilatation of small bowel or colon. Extending from the proximal descending colon through the distal rectum there is subjectively increased mucosal enhancement, and slight hypervascularity in the associated mesocolon/mesorectum. No overt wall thickening or inflammatory changes otherwise noted to suggest acute flare of patient's ulcerative colitis. Normal appendix. Vascular/Lymphatic: No significant atherosclerotic disease, aneurysm or dissection identified in the abdominal or pelvic vasculature. Multiple prominent but non pathologically enlarged retroperitoneal lymph nodes are noted (nonspecific). 8 mm short axis mesorectal lymph node (image 71 of series 2) is noted, but nonspecific. No pathologically enlarged lymph nodes are identified in the abdomen or pelvis. Reproductive: Retroverted uterus. Ovaries are unremarkable in appearance. Other: No significant volume of ascites.  No pneumoperitoneum. Musculoskeletal: There are no aggressive appearing lytic or blastic lesions noted in the  visualized portions of the skeleton. IMPRESSION: 1. Biliary sludge and/or noncalcified gallstones lying dependently in the gallbladder. No findings to suggest acute cholecystitis at this time. If there is clinical concern for acute cholecystitis, further evaluation with right upper quadrant ultrasound could provide additional diagnostic information. 2. Enhancing lesion in the upper left retroperitoneum which is strongly favored to represent a splenule. However, the possibility of an exophytic solid renal lesion extending off the upper pole the left kidney cannot be excluded on the basis of today's examination (as the intervening fat plane between the lesion in the kidney is obscured on several axial, sagittal and coronal images). Accordingly, further evaluation with nonemergent MRI of the abdomen with and without IV gadolinium  is recommended in the near future. 3. Mild hyperenhancement of the mucosa extending from the proximal descending colon through the distal rectum, and hypervascularity in the associated mesocolon/mesorectum. These findings are compatible with the reported clinical history of ulcerative colitis. No other overt findings to suggest acute colonic inflammation at this time. These results will be called to the ordering clinician or representative by the Radiologist Assistant, and communication documented in the PACS or zVision Dashboard. Electronically Signed   By: Trudie Reed M.D.   On: 03/28/2016 15:09   ____________________________________________   PROCEDURES  Procedure(s) performed: None  Procedures  Critical Care performed: No  ____________________________________________   INITIAL IMPRESSION / ASSESSMENT AND PLAN / ED COURSE  Pertinent labs & imaging results that were available during my care of the patient were reviewed by me and considered in my medical decision making (see chart for details).  5:54 AM Patient will be started on IV fluids, pain and nausea meds,  while we were awaiting labs in the ED. We will repeat her amylase and lipase at this time.  5:54 AM Patient's amylase and lipase were very near normal as well as her pain was much improved after the pain and nausea medicine. Patient's CT showed that she did have some gallbladder sludge with some questionable gallstones and ultrasound would be a better study, therefore we ordered an ultrasound prior to the patient's discharge. 5:54 AM It was found the patient had actually had an ultrasound done on July 5 which did not show any gallstones or gallbladder sludge. We're not repeat that ultrasound at this time. Patient was told about the 2 discrepancies and she is just going to follow her primary care M.D. Patient was told to return immediately if condition worsens. ____________________________________________   FINAL CLINICAL IMPRESSION(S) / ED DIAGNOSES  Final diagnoses:  Acute abdominal pain  Hx of pancreatitis  UTI (lower urinary tract infection)      NEW MEDICATIONS STARTED DURING THIS VISIT:  New Prescriptions   HYDROCODONE-ACETAMINOPHEN (NORCO) 5-325 MG TABLET    Take 1 tablet by mouth every 4 (four) hours as needed for moderate pain.   NITROFURANTOIN, MACROCRYSTAL-MONOHYDRATE, (MACROBID) 100 MG CAPSULE    Take 1 capsule (100 mg total) by mouth 2 (two) times daily.   ONDANSETRON (ZOFRAN ODT) 4 MG DISINTEGRATING TABLET    Take 1 tablet (4 mg total) by mouth every 8 (eight) hours as needed for nausea or vomiting.     Note:  This document was prepared using Dragon voice recognition software and may include unintentional dictation errors.    Leona Carry, MD 04/03/16 [phone]

## 2016-04-03 NOTE — ED Notes (Addendum)
Patient ambulatory to triage with steady gait, without difficulty or distress noted; pt reports d/c on Saturday for pancreatitis; c/o returning upper abd pain radiating around into back accomp by loose BMs; last dose tylenol taken at 1030pm without relief

## 2016-04-03 NOTE — Telephone Encounter (Signed)
Please call patients mother (you may speak to patient, she was just sleeping at the time) Patient was in the hospital 7/13 - 7/15 and was consulted by Dr Allen Norris. She went back to the ER around 2am today. Her pain level had increased again. She has history of inflammatory bowel disease with ulcerative colitis per the patient and history of pancreatitis. They would like to know if she should return to her clear liquid diet. Please call at your convenience.

## 2016-04-03 NOTE — ED Notes (Signed)
MD at bedside. 

## 2016-04-03 NOTE — Telephone Encounter (Signed)
Spoke with pt's mother, Misty StanleyLisa regarding her ER visit from last night. She stated she started having severe abdominal pain again. She stated she is not experiencing any vomiting but has bloating and gas. Her pain is just under her breast and sometimes radiates to her back. Can't pinpoint a trigger. Should we move forward with a HIDA scan or bring her in for an appt. She was given pain medication before being discharged this morning. Please advise.

## 2016-04-04 ENCOUNTER — Telehealth: Payer: Self-pay

## 2016-04-04 NOTE — Telephone Encounter (Signed)
Patient spoke with Ginger yesterday pertaining her gallbladder appointment next week. Another GI doctor prescribed her a steroid because they think she is having UC flair-up. The medication name is metro nitzazole. She also wanted to let Ginger know that if there is a cancellation or anyway she can get in for the gallbladder scan before next week, she is available and will be more than happy to come in earlier. A good call back number for her is [phone]

## 2016-04-04 NOTE — Telephone Encounter (Signed)
Pt notified of lab results

## 2016-04-04 NOTE — Telephone Encounter (Signed)
Contacted central scheduling to see if there were any cancellations for a HIDA scan. Nothing available now. Contacted pt and gave her the number to scheduling so she may call herself daily.

## 2016-04-04 NOTE — Telephone Encounter (Signed)
-----   Message from [name] Wohl, MD sent at 04/04/2016  2:14 PM EDT ----- Let the patient know that her blood tests for autoimmune Pancreatitis were negative.

## 2016-04-11 ENCOUNTER — Telehealth: Payer: Self-pay | Admitting: Gastroenterology

## 2016-04-11 NOTE — Telephone Encounter (Signed)
Patient called to let you know that she is having her gallbladder scan tomorrow BUT she is still having pain after eating. She feels you should know this. Please call.

## 2016-04-11 NOTE — Telephone Encounter (Signed)
Contacted pt and informed her if in fact this is her gallbladder, then we can schedule her for an appt with a surgeon usually for a same day appt.

## 2016-04-12 ENCOUNTER — Ambulatory Visit
Admission: RE | Admit: 2016-04-12 | Discharge: 2016-04-12 | Disposition: A | Discharge status: Home / Self Care | Payer: BC Managed Care – PPO | Source: Ambulatory Visit | Attending: Gastroenterology | Admitting: Gastroenterology

## 2016-04-12 DIAGNOSIS — R1084 Generalized abdominal pain: Secondary | ICD-10-CM | POA: Diagnosis not present

## 2016-04-12 MED ORDER — TECHNETIUM TC 99M MEBROFENIN IV KIT
5.2200 | PACK | Freq: Once | INTRAVENOUS | Status: AC | PRN
  Administered 2016-04-12: 5.22 via INTRAVENOUS

## 2016-04-13 ENCOUNTER — Telehealth: Payer: Self-pay | Admitting: Gastroenterology

## 2016-04-13 NOTE — Telephone Encounter (Signed)
Pt has already been notified of HIDA scan results and appt has been made to discuss issues.

## 2016-04-13 NOTE — Telephone Encounter (Signed)
results

## 2016-04-17 ENCOUNTER — Encounter: Payer: Self-pay | Admitting: Family Medicine

## 2016-04-17 ENCOUNTER — Ambulatory Visit (INDEPENDENT_AMBULATORY_CARE_PROVIDER_SITE_OTHER): Payer: BC Managed Care – PPO | Admitting: Family Medicine

## 2016-04-17 VITALS — BP 102/62 | HR 78 | Temp 97.6°F | Resp 16 | Wt 177.0 lb

## 2016-04-17 DIAGNOSIS — K858 Other acute pancreatitis without necrosis or infection: Secondary | ICD-10-CM

## 2016-04-17 DIAGNOSIS — N289 Disorder of kidney and ureter, unspecified: Secondary | ICD-10-CM | POA: Diagnosis not present

## 2016-04-17 MED ORDER — METRONIDAZOLE 250 MG PO TABS: 250.0000 mg | ORAL_TABLET | Freq: Four times a day (QID) | ORAL | 0 refills | Status: AC

## 2016-04-17 NOTE — Progress Notes (Signed)
Patient: Jo Perkins Female    DOB: Mar 31, 1986   30 y.o.   MRN: [id number] Visit Date: 04/17/2016  Today's Provider: Lelon Huh, MD   Chief Complaint  Patient presents with  . Follow-up  . Abdominal Pain   Subjective:    HPI  Follow up ER visit/ Abdominal pain  Patient was seen in ER for abdominal pain on 04/03/2016. She was treated for abdominal pain with history of Pancreatitris. She has long history of ulcerative colitis previously by GI at [facility]  She was seen there on 03/28/16 with 2-3 week history epigastric pain and mucousy stools and ended up being admitted for acute pancreatitis and UTI. She was apparently treated with IV antibiotics and bowel rest. She was evaluated by Dr. Allen Norris while in the hospital and has scheduled follow up with him. Patient reports she had pancreatitis a few years ago attributed to Imuran, but she has been off of these medication for quite a while.   Work up included normal abdominal ultrasound, CT showing biliary sludge, a suspected splenule versus exophytic renal lesion, and enhancement of colonic mucosa c/w ulcerative colitis.   She reports this condition is Improved. Patient presents today requesting to discuss medications. She is about to go out of town for a few weeks and want to be sure she doesn't have another Flare up of abdominal pain. She has follow up scheduled with Dr. Allen Norris on 05/08/2016 and wasn't able to get into see him before she leaves town. She states that past flare ups of u.c. Required oral steroid which worked well.    ------------------------------------------------------------------------------------      Allergies  Allergen Reactions  . Imuran  [Azathioprine]     caused pancreatitis   Current Meds  Medication Sig  . clonazePAM (KLONOPIN) 0.5 MG tablet 1/2-1 tablet every four hours as needed. (Patient taking differently: Take 0.5 mg by mouth 2 (two) times daily. 0.5 mg in the am and .25-0.5 mg as needed)   . HYDROcodone-acetaminophen (NORCO) 5-325 MG tablet Take 1 tablet by mouth every 4 (four) hours as needed for moderate pain.  . Mesalamine (ASACOL) 400 MG CPDR DR capsule Take 800 mg by mouth 3 (three) times daily.   . sertraline (ZOLOFT) 100 MG tablet TAKE 1 TABLET BY MOUTH EVERY DAY  . [DISCONTINUED] ciprofloxacin (CIPRO) 500 MG tablet Take 1 tablet (500 mg total) by mouth 2 (two) times daily.  . [DISCONTINUED] dicyclomine (BENTYL) 10 MG capsule Take 10 mg by mouth 4 (four) times daily.  . [DISCONTINUED] ondansetron (ZOFRAN ODT) 4 MG disintegrating tablet Take 1 tablet (4 mg total) by mouth every 8 (eight) hours as needed for nausea or vomiting.    Review of Systems  Constitutional: Negative for appetite change, chills, fatigue and fever.  Respiratory: Negative for chest tightness and shortness of breath.   Cardiovascular: Negative for chest pain and palpitations.  Gastrointestinal: Positive for abdominal pain. Negative for nausea and vomiting.       Loose stools  Neurological: Negative for dizziness, weakness, light-headedness and headaches.    Social History  Substance Use Topics  . Smoking status: Never Smoker  . Smokeless tobacco: Never Used  . Alcohol use 0.0 oz/week     Comment: rare   Objective:   BP 102/62 (BP Location: Left Arm, Patient Position: Sitting, Cuff Size: Normal)   Pulse 78   Temp 97.6 F (36.4 C) (Oral)   Resp 16   Wt 177 lb (80.3 kg)  LMP 03/28/2016 (Approximate)   BMI 26.14 kg/m   Physical Exam   General Appearance:    Alert, cooperative, no distress  Eyes:    PERRL, conjunctiva/corneas clear, EOM's intact       Lungs:     Clear to auscultation bilaterally, respirations unlabored  Heart:    Regular rate and rhythm  Neurologic:   Awake, alert, oriented x 3. No apparent focal neurological           defect.           Assessment & Plan:     1. Other acute pancreatitis Unclear etiology. She states she was off of Imuran at the time of recent  diagnosis. No signs of cholecystitis on u/s or CT and she does not drink alcohol. Most likely related to UC. Will check lipid panel since she has never had cholesterol or triglycerides checked. She states metronidazole helped recent flare up quite a bit. She is asymptomatic now but will be travelling the next few weeks so I refilled medication for her to take incase she starts having any problems.  - metroNIDAZOLE (FLAGYL) 250 MG tablet; Take 1 tablet (250 mg total) by mouth 4 (four) times daily.  Dispense: 40 tablet; Refill: 0 - Lipid panel  2. Renal lesion Anticipated MRI to follow up questionable renal lesion versus splenule on recent abdominal CT.        Lelon Huh, MD  [facility]

## 2016-04-19 LAB — LIPID PANEL
CHOLESTEROL TOTAL: 165 mg/dL (ref 100–199)
Chol/HDL Ratio: 3.1 ratio units (ref 0.0–4.4)
HDL: 53 mg/dL (ref >39)
LDL Calculated: 87 mg/dL (ref 0–99)
Triglycerides: 125 mg/dL (ref 0–149)
VLDL CHOLESTEROL CAL: 25 mg/dL (ref 5–40)

## 2016-04-23 DIAGNOSIS — N289 Disorder of kidney and ureter, unspecified: Secondary | ICD-10-CM | POA: Insufficient documentation

## 2016-05-05 ENCOUNTER — Encounter: Payer: Self-pay | Admitting: Emergency Medicine

## 2016-05-05 ENCOUNTER — Inpatient Hospital Stay
Admission: EM | Admit: 2016-05-05 | Discharge: 2016-05-08 | DRG: 439 | Disposition: A | Discharge status: Home / Self Care | Payer: BC Managed Care – PPO | Attending: Internal Medicine | Admitting: Internal Medicine

## 2016-05-05 DIAGNOSIS — K859 Acute pancreatitis without necrosis or infection, unspecified: Principal | ICD-10-CM

## 2016-05-05 DIAGNOSIS — Z79899 Other long term (current) drug therapy: Secondary | ICD-10-CM | POA: Diagnosis not present

## 2016-05-05 DIAGNOSIS — K8500 Idiopathic acute pancreatitis without necrosis or infection: Secondary | ICD-10-CM | POA: Diagnosis not present

## 2016-05-05 DIAGNOSIS — N390 Urinary tract infection, site not specified: Secondary | ICD-10-CM | POA: Diagnosis present

## 2016-05-05 DIAGNOSIS — Z823 Family history of stroke: Secondary | ICD-10-CM | POA: Diagnosis not present

## 2016-05-05 DIAGNOSIS — Z801 Family history of malignant neoplasm of trachea, bronchus and lung: Secondary | ICD-10-CM

## 2016-05-05 DIAGNOSIS — K861 Other chronic pancreatitis: Secondary | ICD-10-CM | POA: Diagnosis present

## 2016-05-05 DIAGNOSIS — K5190 Ulcerative colitis, unspecified, without complications: Secondary | ICD-10-CM | POA: Diagnosis present

## 2016-05-05 DIAGNOSIS — R1011 Right upper quadrant pain: Secondary | ICD-10-CM

## 2016-05-05 DIAGNOSIS — D62 Acute posthemorrhagic anemia: Secondary | ICD-10-CM

## 2016-05-05 DIAGNOSIS — Z8249 Family history of ischemic heart disease and other diseases of the circulatory system: Secondary | ICD-10-CM

## 2016-05-05 DIAGNOSIS — F329 Major depressive disorder, single episode, unspecified: Secondary | ICD-10-CM | POA: Diagnosis present

## 2016-05-05 DIAGNOSIS — Z86010 Personal history of colonic polyps: Secondary | ICD-10-CM | POA: Diagnosis not present

## 2016-05-05 DIAGNOSIS — Z888 Allergy status to other drugs, medicaments and biological substances status: Secondary | ICD-10-CM | POA: Diagnosis not present

## 2016-05-05 DIAGNOSIS — R748 Abnormal levels of other serum enzymes: Secondary | ICD-10-CM | POA: Diagnosis present

## 2016-05-05 DIAGNOSIS — R8281 Pyuria: Secondary | ICD-10-CM | POA: Insufficient documentation

## 2016-05-05 DIAGNOSIS — K828 Other specified diseases of gallbladder: Secondary | ICD-10-CM

## 2016-05-05 DIAGNOSIS — Z833 Family history of diabetes mellitus: Secondary | ICD-10-CM | POA: Diagnosis not present

## 2016-05-05 DIAGNOSIS — IMO0002 Reserved for concepts with insufficient information to code with codable children: Secondary | ICD-10-CM | POA: Diagnosis present

## 2016-05-05 HISTORY — DX: Acute pancreatitis without necrosis or infection, unspecified: K85.90

## 2016-05-05 LAB — GASTROINTESTINAL PANEL BY PCR, STOOL (REPLACES STOOL CULTURE)
ASTROVIRUS: NOT DETECTED
Adenovirus F40/41: NOT DETECTED
CAMPYLOBACTER SPECIES: NOT DETECTED
Cryptosporidium: NOT DETECTED
Cyclospora cayetanensis: NOT DETECTED
E. COLI O157: NOT DETECTED
ENTAMOEBA HISTOLYTICA: NOT DETECTED
ENTEROAGGREGATIVE E COLI (EAEC): NOT DETECTED
ENTEROTOXIGENIC E COLI (ETEC): NOT DETECTED
Enteropathogenic E coli (EPEC): NOT DETECTED
GIARDIA LAMBLIA: NOT DETECTED
NOROVIRUS GI/GII: NOT DETECTED
PLESIMONAS SHIGELLOIDES: NOT DETECTED
Rotavirus A: NOT DETECTED
Salmonella species: NOT DETECTED
Sapovirus (I, II, IV, and V): NOT DETECTED
Shiga like toxin producing E coli (STEC): NOT DETECTED
Shigella/Enteroinvasive E coli (EIEC): NOT DETECTED
VIBRIO CHOLERAE: NOT DETECTED
Vibrio species: NOT DETECTED
Yersinia enterocolitica: NOT DETECTED

## 2016-05-05 LAB — COMPREHENSIVE METABOLIC PANEL
ALT: 13 U/L — AB (ref 14–54)
AST: 21 U/L (ref 15–41)
Albumin: 4.1 g/dL (ref 3.5–5.0)
Alkaline Phosphatase: 57 U/L (ref 38–126)
Anion gap: 6 (ref 5–15)
BUN: 8 mg/dL (ref 6–20)
CHLORIDE: 103 mmol/L (ref 101–111)
CO2: 28 mmol/L (ref 22–32)
CREATININE: 0.56 mg/dL (ref 0.44–1.00)
Calcium: 9.4 mg/dL (ref 8.9–10.3)
GFR calc Af Amer: >60 mL/min (ref >60)
GFR calc non Af Amer: >60 mL/min (ref >60)
Glucose, Bld: 102 mg/dL — ABNORMAL HIGH (ref 65–99)
POTASSIUM: 4 mmol/L (ref 3.5–5.1)
SODIUM: 137 mmol/L (ref 135–145)
Total Bilirubin: 0.3 mg/dL (ref 0.3–1.2)
Total Protein: 7.9 g/dL (ref 6.5–8.1)

## 2016-05-05 LAB — URINALYSIS COMPLETE WITH MICROSCOPIC (ARMC ONLY)
BILIRUBIN URINE: NEGATIVE
Glucose, UA: NEGATIVE mg/dL
HGB URINE DIPSTICK: NEGATIVE
KETONES UR: NEGATIVE mg/dL
NITRITE: NEGATIVE
PH: 7 (ref 5.0–8.0)
PROTEIN: NEGATIVE mg/dL
SPECIFIC GRAVITY, URINE: 1.015 (ref 1.005–1.030)

## 2016-05-05 LAB — CBC
HEMATOCRIT: 37.6 % (ref 35.0–47.0)
Hemoglobin: 12.7 g/dL (ref 12.0–16.0)
MCH: 28.2 pg (ref 26.0–34.0)
MCHC: 33.7 g/dL (ref 32.0–36.0)
MCV: 83.7 fL (ref 80.0–100.0)
PLATELETS: 276 10*3/uL (ref 150–440)
RBC: 4.5 MIL/uL (ref 3.80–5.20)
RDW: 14.3 % (ref 11.5–14.5)
WBC: 10 10*3/uL (ref 3.6–11.0)

## 2016-05-05 LAB — C DIFFICILE QUICK SCREEN W PCR REFLEX
C DIFFICILE (CDIFF) INTERP: NOT DETECTED
C DIFFICILE (CDIFF) TOXIN: NEGATIVE
C Diff antigen: NEGATIVE

## 2016-05-05 LAB — POCT PREGNANCY, URINE: Preg Test, Ur: NEGATIVE

## 2016-05-05 LAB — LIPASE, BLOOD: LIPASE: 955 U/L — AB (ref 11–51)

## 2016-05-05 LAB — HEMOGLOBIN A1C: HEMOGLOBIN A1C: 5.2 % (ref 4.0–6.0)

## 2016-05-05 MED ORDER — MORPHINE SULFATE (PF) 4 MG/ML IV SOLN
4.0000 mg | Freq: Once | INTRAVENOUS | Status: AC
  Administered 2016-05-05: 4 mg via INTRAVENOUS
  Filled 2016-05-05: qty 1

## 2016-05-05 MED ORDER — BUDESONIDE 3 MG PO CPEP
3.0000 mg | ORAL_CAPSULE | Freq: Every day | ORAL | Status: DC
  Administered 2016-05-05 – 2016-05-08 (×3): 3 mg via ORAL
  Filled 2016-05-05 (×3): qty 1

## 2016-05-05 MED ORDER — HYDROCODONE-ACETAMINOPHEN 5-325 MG PO TABS: 1.0000 | ORAL_TABLET | ORAL | Status: DC | PRN

## 2016-05-05 MED ORDER — SERTRALINE HCL 100 MG PO TABS
100.0000 mg | ORAL_TABLET | Freq: Every day | ORAL | Status: DC
  Administered 2016-05-05 – 2016-05-08 (×3): 100 mg via ORAL
  Filled 2016-05-05 (×3): qty 1

## 2016-05-05 MED ORDER — ONDANSETRON HCL 4 MG/2ML IJ SOLN
4.0000 mg | Freq: Four times a day (QID) | INTRAMUSCULAR | Status: DC | PRN
  Administered 2016-05-05: 4 mg via INTRAVENOUS
  Filled 2016-05-05: qty 2

## 2016-05-05 MED ORDER — MORPHINE SULFATE (PF) 4 MG/ML IV SOLN
4.0000 mg | INTRAVENOUS | Status: DC | PRN
  Administered 2016-05-05 – 2016-05-07 (×6): 4 mg via INTRAVENOUS
  Filled 2016-05-05 (×6): qty 1

## 2016-05-05 MED ORDER — PREDNISONE 20 MG PO TABS
40.0000 mg | ORAL_TABLET | Freq: Every day | ORAL | Status: DC
  Administered 2016-05-06 – 2016-05-08 (×2): 40 mg via ORAL
  Filled 2016-05-05 (×3): qty 2

## 2016-05-05 MED ORDER — ONDANSETRON HCL 4 MG/2ML IJ SOLN
4.0000 mg | Freq: Once | INTRAMUSCULAR | Status: AC
  Administered 2016-05-05: 4 mg via INTRAVENOUS
  Filled 2016-05-05: qty 2

## 2016-05-05 MED ORDER — MESALAMINE 400 MG PO CPDR
800.0000 mg | DELAYED_RELEASE_CAPSULE | Freq: Three times a day (TID) | ORAL | Status: DC
  Filled 2016-05-05: qty 2

## 2016-05-05 MED ORDER — CLONAZEPAM 0.5 MG PO TBDP: 0.5000 mg | ORAL_TABLET | ORAL | Status: DC | PRN

## 2016-05-05 MED ORDER — TUBERCULIN PPD 5 UNIT/0.1ML ID SOLN
5.0000 [IU] | Freq: Once | INTRADERMAL | Status: AC
Start: 2016-05-05 — End: 2016-05-07
  Administered 2016-05-05: 5 [IU] via INTRADERMAL
  Filled 2016-05-05: qty 0.1

## 2016-05-05 MED ORDER — ENOXAPARIN SODIUM 40 MG/0.4ML ~~LOC~~ SOLN
40.0000 mg | SUBCUTANEOUS | Status: DC
Start: 2016-05-05 — End: 2016-05-06
  Administered 2016-05-05 – 2016-05-06 (×2): 40 mg via SUBCUTANEOUS
  Filled 2016-05-05 (×2): qty 0.4

## 2016-05-05 MED ORDER — OXYCODONE HCL 5 MG PO TABS
5.0000 mg | ORAL_TABLET | ORAL | Status: DC | PRN
  Administered 2016-05-06 – 2016-05-07 (×4): 5 mg via ORAL
  Filled 2016-05-05 (×5): qty 1

## 2016-05-05 MED ORDER — SODIUM CHLORIDE 0.9 % IV BOLUS (SEPSIS)
1000.0000 mL | Freq: Once | INTRAVENOUS | Status: AC
  Administered 2016-05-05: 1000 mL via INTRAVENOUS

## 2016-05-05 MED ORDER — SODIUM CHLORIDE 0.9 % IV SOLN
INTRAVENOUS | Status: DC
  Administered 2016-05-05 – 2016-05-06 (×4): via INTRAVENOUS

## 2016-05-05 MED ORDER — DEXTROSE 5 % IV SOLN
1.0000 g | INTRAVENOUS | Status: DC
  Administered 2016-05-05 – 2016-05-06 (×2): 1 g via INTRAVENOUS
  Filled 2016-05-05 (×2): qty 10

## 2016-05-05 NOTE — ED Notes (Signed)
Patient seen by Smith MinceWahl MD for GI monitoring of Ulcerative Colitis. Seen here prior for pancreatitis

## 2016-05-05 NOTE — H&P (Signed)
[facility] NAME: Jo Perkins    MR#:  [id number]  DATE OF BIRTH:  1986/07/28  DATE OF ADMISSION:  05/05/2016  PRIMARY CARE PHYSICIAN: Lelon Huh, MD   REQUESTING/REFERRING PHYSICIAN:   CHIEF COMPLAINT:   Chief Complaint  Patient presents with  . Abdominal Pain    HISTORY OF PRESENT ILLNESS: Jo Perkins  is a 30 y.o. female with a known history of Depression, colon polyps, panic attacks, ulcerative colitis, who presented to the hospital at the beginning of July 2017 with acute pancreatitis, ulcerative colitis, urinary tract infection, comes back to the hospital with upper abdominal pain and discomfort, nausea, rectal bleed, some loose stools. According to patient, she was discharged on July 15, however, she's been having upper abdominal pain ever since. The patient described upper abdominal pain as pain as sharp, intermittent, with no alleviating or aggravating factors, not necessarily related to meals. This pain worsened yesterday, became throbbing, 8/10 by intensity, radiated to the back, intermittent, coming on every 5-10 minutes, though improving to about 4 out of 10 by intensity. Just prior to coming to the emergency room, her pain became constant, associated with nausea. Patient also noted rectal bleeding, small amount of blood, also loose stool. She noted weight loss, approximately 3 or 4 pounds in about 1 week because of stool incontinence, rectal bleeding, abdominal pain, she decided to come to emergency room for further evaluation and treatment. In emergency room, she was noted to have elevated lipase level at hospitalist services were contacted for admission.     PAST MEDICAL HISTORY:   Past Medical History:  Diagnosis Date  . Depression   . History of colon polyps   . Panic attack   . Ulcerative colitis ([place])     PAST SURGICAL HISTORY: Past Surgical History:  Procedure Laterality Date  . none      SOCIAL  HISTORY:  Social History  Substance Use Topics  . Smoking status: Never Smoker  . Smokeless tobacco: Never Used  . Alcohol use 0.0 oz/week     Comment: rare    FAMILY HISTORY:  Family History  Problem Relation Age of Onset  . Diabetes Maternal Grandmother     type 2  . Stroke Maternal Grandmother   . Heart attack Maternal Grandmother   . Lung cancer Maternal Grandfather     DRUG ALLERGIES:  Allergies  Allergen Reactions  . Imuran  [Azathioprine]     caused pancreatitis    Review of Systems  Constitutional: Positive for weight loss. Negative for chills and fever.  HENT: Negative for congestion.   Eyes: Negative for blurred vision and double vision.  Respiratory: Negative for cough, sputum production, shortness of breath and wheezing.   Cardiovascular: Negative for chest pain, palpitations, orthopnea, leg swelling and PND.  Gastrointestinal: Positive for abdominal pain, blood in stool, diarrhea and nausea. Negative for constipation and vomiting.  Genitourinary: Negative for dysuria, frequency, hematuria and urgency.  Musculoskeletal: Negative for falls.  Neurological: Negative for dizziness, tremors, focal weakness and headaches.  Endo/Heme/Allergies: Does not bruise/bleed easily.  Psychiatric/Behavioral: Negative for depression. The patient does not have insomnia.     MEDICATIONS AT HOME:  Prior to Admission medications   Medication Sig Start Date End Date Taking? Authorizing Provider  clonazePAM (KLONOPIN) 0.5 MG tablet 1/2-1 tablet every four hours as needed. Patient taking differently: Take 0.5 mg by mouth 2 (two) times daily.  02/08/16  Yes Birdie Sons, MD  Mesalamine (ASACOL) 400 MG CPDR DR capsule Take 800 mg by mouth 3 (three) times daily.    Yes Historical Provider, MD  sertraline (ZOLOFT) 100 MG tablet TAKE 1 TABLET BY MOUTH EVERY DAY Patient taking differently: TAKE 1 TABLET BY MOUTH EVERY MORNING. 03/05/16  Yes Birdie Sons, MD  HYDROcodone-acetaminophen  (NORCO) 5-325 MG tablet Take 1 tablet by mouth every 4 (four) hours as needed for moderate pain. Patient not taking: Reported on 05/05/2016 04/03/16   Ruby Cola, MD      PHYSICAL EXAMINATION:   VITAL SIGNS: Blood pressure 122/68, pulse (!) 57, temperature 98.2 F (36.8 C), temperature source Oral, resp. rate 14, height 5' 9"  (1.753 m), weight 78.5 kg (173 lb), last menstrual period 04/21/2016, SpO2 100 %.  GENERAL:  30 y.o.-year-old patient lying in the bed with no acute distress.  EYES: Pupils equal, round, reactive to light and accommodation. No scleral icterus. Extraocular muscles intact.  HEENT: Head atraumatic, normocephalic. Oropharynx and nasopharynx clear.  NECK:  Supple, no jugular venous distention. No thyroid enlargement, no tenderness.  LUNGS: Normal breath sounds bilaterally, no wheezing, rales,rhonchi or crepitation. No use of accessory muscles of respiration.  CARDIOVASCULAR: S1, S2 normal. No murmurs, rubs, or gallops.  ABDOMEN: Soft, tender in about abdomen but no rebound or guarding, nondistended. Bowel sounds present. No organomegaly or mass.  EXTREMITIES: No pedal edema, cyanosis, or clubbing.  NEUROLOGIC: Cranial nerves II through XII are intact. Muscle strength 5/5 in all extremities. Sensation intact. Gait not checked.  PSYCHIATRIC: The patient is alert and oriented x 3.  SKIN: No obvious rash, lesion, or ulcer.   LABORATORY PANEL:   CBC  Recent Labs Lab 05/05/16 0716  WBC 10.0  HGB 12.7  HCT 37.6  PLT 276  MCV 83.7  MCH 28.2  MCHC 33.7  RDW 14.3   ------------------------------------------------------------------------------------------------------------------  Chemistries   Recent Labs Lab 05/05/16 0716  NA 137  K 4.0  CL 103  CO2 28  GLUCOSE 102*  BUN 8  CREATININE 0.56  CALCIUM 9.4  AST 21  ALT 13*  ALKPHOS 57  BILITOT 0.3    ------------------------------------------------------------------------------------------------------------------  Cardiac Enzymes No results for input(s): TROPONINI in the last 168 hours. ------------------------------------------------------------------------------------------------------------------  RADIOLOGY: No results found.  EKG: No orders found for this or any previous visit.  IMPRESSION AND PLAN:  Principal Problem:   Acute pancreatitis Active Problems:   UTI (lower urinary tract infection)   Recurrent pancreatitis ([place]) #1. Recurrent pancreatitis, admit the patient to the medical floor, initiate nothing by mouth, IV fluids, get gastroenterologist involved for further recommendations, start Protonix. Stop Imuran, as it may be cause of recurrent pancreatitis #2. Ulcerative colitis exacerbation,, continue budesonide, Asacol, gastroenterology consultation is requested #3. Urinary tract infection, patient was given Rocephin in the past, reinitiate Rocephin while she is in the hospital, awaiting for urinary cultures #4. History of depression, continue Zoloft #5. Hypokalemia, resolved, since last admission   All the records are reviewed and case discussed with ED provider. Management plans discussed with the patient, family and they are in agreement.  CODE STATUS: Code Status History    Date Active Date Inactive Code Status Order ID Comments User Context   03/30/2016  1:02 AM 03/31/2016  6:46 PM Full Code [id number]  Harrie Foreman, MD Inpatient       TOTAL TIME TAKING CARE OF THIS PATIENT: 50 minutes.    Theodoro Grist M.D on 05/05/2016 at 10:01 AM  Between 7am to 6pm - Pager - [phone]  After 6pm go to [email or web address] - password [email or web address] [facility]  Office  [phone]  CC: Primary care physician; Lelon Huh, MD

## 2016-05-05 NOTE — Consult Note (Signed)
Jo Perkins Jo Panek, MD [facility]  [address] [place], [place] [address] Phone: [phone] Fax : [phone]  Consultation  Referring Provider:     No ref. provider found Primary Care Physician:  Mila Merryonald Fisher, MD Primary Gastroenterologist:  Dr. Servando SnareWohl         Reason for Consultation:     Recurrent pancreatitis  Date of Admission:  05/05/2016 Date of Consultation:  05/05/2016         HPI:   Jo QuakerMary K Perkins is a 30 y.o. female Who has a history of ulcerative colitis and recurrent pancreatitis.  The patient was on Imuran back in 2010 and it was thought that that may have caused her initial bout of pancreatitis but the patient continues to have intermittent episodes of pancreatitis.  The patient also reports that she is been having rectal bleeding with some diarrhea recently.  The patient was recently on a [place] to [place] and states she felt fine when she was there.  The patient is also on budesonide and mesalamine at the present time.  There is no report of any unexplained weight loss but the patient has been trying to lose weight.  There is also a report of the patient having some abdominal discomfort with some nausea and diarrhea.  During her last office visit we had discussed stopping the medication she was on and putting her on a biologic.  The patient was amenable to that recommendation but had been doing well at the time.  Past Medical History:  Diagnosis Date  . Depression   . History of colon polyps   . Panic attack   . Ulcerative colitis [facility](HCC)     Past Surgical History:  Procedure Laterality Date  . none      Prior to Admission medications   Medication Sig Start Date End Date Taking? Authorizing Provider  clonazePAM (KLONOPIN) 0.5 MG tablet 1/2-1 tablet every four hours as needed. Patient taking differently: Take 0.5 mg by mouth 2 (two) times daily.  02/08/16  Yes Malva Limesonald E Fisher, MD  Mesalamine (ASACOL) 400 MG CPDR DR capsule Take 800 mg by mouth 3 (three) times daily.    Yes  Historical Provider, MD  sertraline (ZOLOFT) 100 MG tablet TAKE 1 TABLET BY MOUTH EVERY DAY Patient taking differently: TAKE 1 TABLET BY MOUTH EVERY MORNING. 03/05/16  Yes Malva Limesonald E Fisher, MD  HYDROcodone-acetaminophen (NORCO) 5-325 MG tablet Take 1 tablet by mouth every 4 (four) hours as needed for moderate pain. Patient not taking: Reported on 05/05/2016 04/03/16   Leona CarryLinda M Taylor, MD    Family History  Problem Relation Age of Onset  . Diabetes Maternal Grandmother     type 2  . Stroke Maternal Grandmother   . Heart attack Maternal Grandmother   . Lung cancer Maternal Grandfather      Social History  Substance Use Topics  . Smoking status: Never Smoker  . Smokeless tobacco: Never Used  . Alcohol use 0.0 oz/week     Comment: rare    Allergies as of 05/05/2016 - Review Complete 05/05/2016  Allergen Reaction Noted  . Imuran  [azathioprine]  03/16/2015    Review of Systems:    All systems reviewed and negative except where noted in HPI.   Physical Exam:  Vital signs in last 24 hours: Temp:  [98.2 F (36.8 C)-98.3 F (36.8 C)] 98.2 F (36.8 C) (08/19 1318) Pulse Rate:  [53-72] 64 (08/19 1318) Resp:  [9-18] 18 (08/19 1318) BP: (93-122)/(64-91) 110/68 (08/19 1318) SpO2:  [  98 %-100 %] 100 % (08/19 1318) Weight:  [173 lb (78.5 kg)] 173 lb (78.5 kg) (08/19 0713) Last BM Date: 05/05/16 General:   Pleasant, cooperative in NAD Head:  Normocephalic and atraumatic. Eyes:   No icterus.   Conjunctiva pink. PERRLA. Ears:  Normal auditory acuity. Neck:  Supple; no masses or thyroidomegaly Lungs: Respirations even and unlabored. Lungs clear to auscultation bilaterally.   No wheezes, crackles, or rhonchi.  Heart:  Regular rate and rhythm;  Without murmur, clicks, rubs or gallops Abdomen:  Soft, nondistended, nontender. Normal bowel sounds. No appreciable masses or hepatomegaly.  No rebound or guarding.  Rectal:  Not performed. Msk:  Symmetrical without gross deformities.      Extremities:  Without edema, cyanosis or clubbing. Neurologic:  Alert and oriented x3;  grossly normal neurologically. Skin:  Intact without significant lesions or rashes. Cervical Nodes:  No significant cervical adenopathy. Psych:  Alert and cooperative. Normal affect.  LAB RESULTS:  Recent Labs  05/05/16 0716  WBC 10.0  HGB 12.7  HCT 37.6  PLT 276   BMET  Recent Labs  05/05/16 0716  NA 137  K 4.0  CL 103  CO2 28  GLUCOSE 102*  BUN 8  CREATININE 0.56  CALCIUM 9.4   LFT  Recent Labs  05/05/16 0716  PROT 7.9  ALBUMIN 4.1  AST 21  ALT 13*  ALKPHOS 57  BILITOT 0.3   PT/INR No results for input(s): LABPROT, INR in the last 72 hours.  STUDIES: No results found.    Impression / Plan:   Jo Perkins is a 30 y.o. y/o female with a history of ulcerative colitis and she is now admitted with recurrent pancreatitis.  The patient's lipase was elevated. The patient is presently on budesonide and mesalamine.  Mesalamine has a side effect of possible pancreatitis.  The patient will have her mesalamine stopped and she will be started on steroids at the present time.  The patient will also have a PPD placed prior to starting biological treatment.  She has also been told the possible side effects including brain lesions with the biologic medications.  If the change in the medication does not resolve her recurrent pancreatic that she may need to have her gallbladder taken out for possible micro-lithiasis.  The patient has been explained the plan and agrees with it.  Thank you for involving me in the care of this patient.      LOS: 0 days   Jo Perkins Faizan Geraci, MD  05/05/2016, 2:51 PM   Note: This dictation was prepared with Dragon dictation along with smaller phrase technology. Any transcriptional errors that result from this process are unintentional.

## 2016-05-05 NOTE — ED Triage Notes (Signed)
Pt presents with generlized abd pain started yesterday. Denies any urinary sx. States some nausea.

## 2016-05-05 NOTE — ED Provider Notes (Signed)
[facility] Emergency Department Provider Note  ____________________________________________   First MD Initiated Contact with Patient 05/05/16 [phone]     (approximate)  I have reviewed the triage vital signs and the nursing notes.   HISTORY  Chief Complaint Abdominal Pain   HPI Jo Perkins is a 30 y.o. female with a history of ulcerative colitis as well as pancreatitis was presenting to the emergency department with epigastric abdominal pain. Says the pain is a 7-8 out of 10 at this time and is cramping and constant over the past 2 hours. However, it has been worsening and intermittent over the past day. The patient had a recent bout of pancreatitis 1 month ago. It was unclear exactly what was the etiology at that time. She hasn't had a flare several years ago which was thought to be related to Imuran but she is no longer taking this medicine. She had a workup for gallbladder pathology at the time where she ended up having a negative HIDA scan by her outpatient gastroenterologist. She says that she has had nausea but no vomiting. Has had some loose stools but denies any blood in the stool.   Past Medical History:  Diagnosis Date  . Depression   . History of colon polyps   . Panic attack   . Ulcerative colitis [facility](HCC)     Patient Active Problem List   Diagnosis Date Noted  . Acute pancreatitis 05/05/2016  . UTI (lower urinary tract infection) 05/05/2016  . Recurrent pancreatitis (HCC) 05/05/2016  . Renal lesion 04/23/2016  . Pancreatitis 03/30/2016  . Pancreatitis, acute   . Elevated lipase   . History of colon polyps 03/16/2015  . Panic 03/16/2015  . 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 none      Prior to Admission medications   Medication Sig Start Date End Date Taking? Authorizing Provider  clonazePAM (KLONOPIN) 0.5 MG tablet 1/2-1 tablet every four hours as needed. Patient taking  differently: Take 0.5 mg by mouth 2 (two) times daily.  02/08/16  Yes Malva Limesonald E Fisher, MD  Mesalamine (ASACOL) 400 MG CPDR DR capsule Take 800 mg by mouth 3 (three) times daily.    Yes Historical Provider, MD  sertraline (ZOLOFT) 100 MG tablet TAKE 1 TABLET BY MOUTH EVERY DAY Patient taking differently: TAKE 1 TABLET BY MOUTH EVERY MORNING. 03/05/16  Yes Malva Limesonald E Fisher, MD  HYDROcodone-acetaminophen (NORCO) 5-325 MG tablet Take 1 tablet by mouth every 4 (four) hours as needed for moderate pain. Patient not taking: Reported on 05/05/2016 04/03/16   Leona CarryLinda M Taylor, MD    Allergies Imuran  [azathioprine]  Family History  Problem Relation Age of Onset  . Diabetes Maternal Grandmother     type 2  . Stroke Maternal Grandmother   . Heart attack Maternal Grandmother   . Lung cancer Maternal Grandfather     Social History Social History  Substance Use Topics  . Smoking status: Never Smoker  . Smokeless tobacco: Never Used  . Alcohol use 0.0 oz/week     Comment: rare    Review of Systems Constitutional: No fever/chills Eyes: No visual changes. ENT: No sore throat. Cardiovascular: Denies chest pain. Respiratory: Denies shortness of breath. Gastrointestinal: no vomiting.  No constipation. Genitourinary: Negative for dysuria. Musculoskeletal: Negative for back pain. Skin: Negative for rash. Neurological: Negative for headaches, focal weakness or numbness.  10-point ROS otherwise negative.  ____________________________________________   PHYSICAL EXAM:  VITAL  SIGNS: ED Triage Vitals [05/05/16 0713]  Enc Vitals Group     BP 107/66     Pulse Rate 72     Resp 18     Temp 98.2 F (36.8 C)     Temp Source Oral     SpO2 98 %     Weight 173 lb (78.5 kg)     Height 5\' 9"  (1.753 m)     Head Circumference      Peak Flow      Pain Score 7     Pain Loc      Pain Edu?      Excl. in GC?     Constitutional: Alert and oriented. Well appearing and in no acute distress. Eyes:  Conjunctivae are normal. PERRL. EOMI. Head: Atraumatic. Nose: No congestion/rhinnorhea. Mouth/Throat: Mucous membranes are moist.   Neck: No stridor.   Cardiovascular: Normal rate, regular rhythm. Grossly normal heart sounds.  Good peripheral circulation. Respiratory: Normal respiratory effort.  No retractions. Lungs CTAB. Gastrointestinal: Soft with moderate epigastric tenderness. No distention.  Musculoskeletal: No lower extremity tenderness nor edema.  No joint effusions. Neurologic:  Normal speech and language. No gross focal neurologic deficits are appreciated.  Skin:  Skin is warm, dry and intact. No rash noted. Psychiatric: Mood and affect are normal. Speech and behavior are normal.  ____________________________________________   LABS (all labs ordered are listed, but only abnormal results are displayed)  Labs Reviewed  LIPASE, BLOOD - Abnormal; Notable for the following:       Result Value   Lipase 955 (*)    All other components within normal limits  COMPREHENSIVE METABOLIC PANEL - Abnormal; Notable for the following:    Glucose, Bld 102 (*)    ALT 13 (*)    All other components within normal limits  URINALYSIS COMPLETEWITH MICROSCOPIC ([facility] ONLY) - Abnormal; Notable for the following:    Color, Urine YELLOW (*)    APPearance HAZY (*)    Leukocytes, UA 1+ (*)    Bacteria, UA RARE (*)    Squamous Epithelial / LPF 0-5 (*)    All other components within normal limits  CBC  POC URINE PREG, ED  POCT PREGNANCY, URINE   ____________________________________________  EKG   ____________________________________________  RADIOLOGY   ____________________________________________   PROCEDURES  Procedure(s) performed:   Procedures  Critical Care performed:   ____________________________________________   INITIAL IMPRESSION / ASSESSMENT AND PLAN / ED COURSE  Pertinent labs & imaging results that were available during my care of the patient were reviewed by me  and considered in my medical decision making (see chart for details).  ----------------------------------------- 9:19 AM on 05/05/2016 -----------------------------------------  Patient now with 2 out of 10 pain after IV morphine. Elevated lipase without a clear cause for her pancreatitis. I discussed the case with Dr. Seth BakeV of the admitting hospitalist service will admit the patient. I also discussed the case with the patient's gastrologist, Dr. Servando SnareWohl, who agrees with the plan at the current time for IV fluids and pain management. The patient was made aware that she'll need to be admitted to the hospital. She is understanding of this plan and willing to comply.  Clinical Course     ____________________________________________   FINAL CLINICAL IMPRESSION(S) / ED DIAGNOSES  Pancreatitis    NEW MEDICATIONS STARTED DURING THIS VISIT:  New Prescriptions   No medications on file     Note:  This document was prepared using Dragon voice recognition software and may include unintentional dictation  errors.    Myrna Blazer, MD 05/05/16 [phone]

## 2016-05-05 NOTE — Progress Notes (Signed)
DR Winona LegatoVaickute was made aware of pt c-diff negative stool result , stated to keep pt on isolation, until all the other stool result are resulted, as pt is still having watery loose stool

## 2016-05-06 DIAGNOSIS — K861 Other chronic pancreatitis: Secondary | ICD-10-CM

## 2016-05-06 LAB — COMPREHENSIVE METABOLIC PANEL
ALBUMIN: 3.4 g/dL — AB (ref 3.5–5.0)
ALT: 10 U/L — ABNORMAL LOW (ref 14–54)
AST: 15 U/L (ref 15–41)
Alkaline Phosphatase: 49 U/L (ref 38–126)
Anion gap: 6 (ref 5–15)
BUN: 6 mg/dL (ref 6–20)
CHLORIDE: 104 mmol/L (ref 101–111)
CO2: 25 mmol/L (ref 22–32)
Calcium: 8.6 mg/dL — ABNORMAL LOW (ref 8.9–10.3)
Creatinine, Ser: 0.64 mg/dL (ref 0.44–1.00)
GFR calc Af Amer: >60 mL/min (ref >60)
Glucose, Bld: 82 mg/dL (ref 65–99)
POTASSIUM: 3.7 mmol/L (ref 3.5–5.1)
SODIUM: 135 mmol/L (ref 135–145)
Total Bilirubin: 0.7 mg/dL (ref 0.3–1.2)
Total Protein: 6.6 g/dL (ref 6.5–8.1)

## 2016-05-06 LAB — CBC
HEMATOCRIT: 32.4 % — AB (ref 35.0–47.0)
Hemoglobin: 10.9 g/dL — ABNORMAL LOW (ref 12.0–16.0)
MCH: 28.4 pg (ref 26.0–34.0)
MCHC: 33.8 g/dL (ref 32.0–36.0)
MCV: 84.2 fL (ref 80.0–100.0)
Platelets: 189 10*3/uL (ref 150–440)
RBC: 3.85 MIL/uL (ref 3.80–5.20)
RDW: 14.1 % (ref 11.5–14.5)
WBC: 8.2 10*3/uL (ref 3.6–11.0)

## 2016-05-06 LAB — URINE CULTURE: CULTURE: NO GROWTH

## 2016-05-06 LAB — LIPASE, BLOOD: Lipase: 387 U/L — ABNORMAL HIGH (ref 11–51)

## 2016-05-06 LAB — C-REACTIVE PROTEIN: CRP: 1.1 mg/dL — ABNORMAL HIGH (ref <1.0)

## 2016-05-06 MED ORDER — POTASSIUM CHLORIDE IN NACL 20-0.9 MEQ/L-% IV SOLN
INTRAVENOUS | Status: DC
  Administered 2016-05-06 – 2016-05-07 (×4): via INTRAVENOUS
  Filled 2016-05-06 (×6): qty 1000

## 2016-05-06 NOTE — Progress Notes (Signed)
[facility] [place] NAME: Jo Perkins    MR#:  [id number]  DATE OF BIRTH:  1986-08-29  SUBJECTIVE:  CHIEF COMPLAINT:   Chief Complaint  Patient presents with  . Abdominal Pain  The patient is 30 year old Caucasian female with history of ulcerative colitis, recent admission for pancreatitis, who presents to the hospital with recurrent worsening abdominal pain, diarrhea and rectal bleeding.patient's initial labs were unremarkable. Patient was seen by gastroenterologist who suggested steroids orally and discontinue mesalamine due to concerns of pancreatitis. Patient feels better today, less pain,Is willing to try clear liquid diet. Some diarrheal stool, last one last night, some blood in stool, mixed in  Review of Systems  Constitutional: Negative for chills, fever and weight loss.  HENT: Negative for congestion.   Eyes: Negative for blurred vision and double vision.  Respiratory: Negative for cough, sputum production, shortness of breath and wheezing.   Cardiovascular: Negative for chest pain, palpitations, orthopnea, leg swelling and PND.  Gastrointestinal: Positive for abdominal pain, blood in stool and diarrhea. Negative for constipation, nausea and vomiting.  Genitourinary: Negative for dysuria, frequency, hematuria and urgency.  Musculoskeletal: Negative for falls.  Neurological: Negative for dizziness, tremors, focal weakness and headaches.  Endo/Heme/Allergies: Does not bruise/bleed easily.  Psychiatric/Behavioral: Negative for depression. The patient does not have insomnia.     VITAL SIGNS: Blood pressure 106/62, pulse 61, temperature 98.1 F (36.7 C), temperature source Oral, resp. rate 18, height 5' 9"  (1.753 m), weight 78.5 kg (173 lb), last menstrual period 04/21/2016, SpO2 100 %.  PHYSICAL EXAMINATION:   GENERAL:  30 y.o.-year-old patient lying in the bed with no acute distress.  EYES: Pupils equal, round, reactive to  light and accommodation. No scleral icterus. Extraocular muscles intact.  HEENT: Head atraumatic, normocephalic. Oropharynx and nasopharynx clear.  NECK:  Supple, no jugular venous distention. No thyroid enlargement, no tenderness.  LUNGS: Normal breath sounds bilaterally, no wheezing, rales,rhonchi or crepitation. No use of accessory muscles of respiration.  CARDIOVASCULAR: S1, S2 normal. No murmurs, rubs, or gallops.  ABDOMEN: Soft, nontender, nondistended. Bowel sounds present. No organomegaly or mass.  EXTREMITIES: No pedal edema, cyanosis, or clubbing.  NEUROLOGIC: Cranial nerves II through XII are intact. Muscle strength 5/5 in all extremities. Sensation intact. Gait not checked.  PSYCHIATRIC: The patient is alert and oriented x 3.  SKIN: No obvious rash, lesion, or ulcer.   ORDERS/RESULTS REVIEWED:   CBC  Recent Labs Lab 05/05/16 0716 05/06/16 0613  WBC 10.0 8.2  HGB 12.7 10.9*  HCT 37.6 32.4*  PLT 276 189  MCV 83.7 84.2  MCH 28.2 28.4  MCHC 33.7 33.8  RDW 14.3 14.1   ------------------------------------------------------------------------------------------------------------------  Chemistries   Recent Labs Lab 05/05/16 0716 05/06/16 0613  NA 137 135  K 4.0 3.7  CL 103 104  CO2 28 25  GLUCOSE 102* 82  BUN 8 6  CREATININE 0.56 0.64  CALCIUM 9.4 8.6*  AST 21 15  ALT 13* 10*  ALKPHOS 57 49  BILITOT 0.3 0.7   ------------------------------------------------------------------------------------------------------------------ estimated creatinine clearance is 108.4 mL/min (by C-G formula based on SCr of 0.8 mg/dL). ------------------------------------------------------------------------------------------------------------------ No results for input(s): TSH, T4TOTAL, T3FREE, THYROIDAB in the last 72 hours.  Invalid input(s): FREET3  Cardiac Enzymes No results for input(s): CKMB, TROPONINI, MYOGLOBIN in the last 168 hours.  Invalid input(s):  CK ------------------------------------------------------------------------------------------------------------------ Invalid input(s): POCBNP ---------------------------------------------------------------------------------------------------------------  RADIOLOGY: No results found.  EKG: No orders found for this or any previous visit.  ASSESSMENT AND PLAN:  Principal Problem:   Acute pancreatitis Active Problems:   UTI (lower urinary tract infection)   Recurrent pancreatitis ([facility])  #1. Acute pancreatitis, off mesalamine, lipase has improved, gastroenterologist feels that gallbladder Surgery may be warranted due to possible microlithiasis. Continue IV fluids, initiate clear liquid diet, follow lipase level in the morning #2ulcerative colitis exacerbation, initiated on steroids orally, continue budesonide, patient is being prepped for appreciate of biological medications by gastroenterologist. #3. Sterile pyuria, no growth in urine cultures, discontinue antibiotic, #4acute posthemorrhagic anemia , decrease IV fluid rate, discontinue Lovenox, follow hemoglobin level in the morning. No need to transfuse at present.  Management plans discussed with the patient, family and they are in agreement.   DRUG ALLERGIES:  Allergies  Allergen Reactions  . Imuran  [Azathioprine]     caused pancreatitis    CODE STATUS:     Code Status Orders        Start     Ordered   05/05/16 1014  Full code  Continuous     05/05/16 1013    Code Status History    Date Active Date Inactive Code Status Order ID Comments User Context   03/30/2016  1:02 AM 03/31/2016  6:46 PM Full Code [id number]  Harrie Foreman, MD Inpatient      TOTAL TIME TAKING CARE OF THIS PATIENT: 40 minutes.  Discussed with patient's mother  Theodoro Grist M.D on 05/06/2016 at 12:05 PM  Between 7am to 6pm - Pager - [phone]  After 6pm go to [email or web address] - password [email or web address] [facility]  Office   [phone]  CC: Primary care physician; Lelon Huh, MD

## 2016-05-06 NOTE — Progress Notes (Signed)
Rudy Luhmann, MD [facility]   [address] [place], [place] [address] Phone: [phone] Fax : [phone]   Subjective: The patient reports that she feels much better today without as much pain.  The patient has had her mesalamine stopped due to the association with pancreatitis.  The patient's lipase has significantly dropped overnight but has not come back to normal.   Objective: Vital signs in last 24 hours: Vitals:   05/05/16 1318 05/05/16 2148 05/06/16 0441 05/06/16 1258  BP: 110/68 (!) 105/59 106/62 104/60  Pulse: 64 61 61 (!) 55  Resp: 18 18 18 18   Temp: 98.2 F (36.8 C) 98.4 F (36.9 C) 98.1 F (36.7 C) 98.3 F (36.8 C)  TempSrc: Oral Oral Oral Oral  SpO2: 100% 97% 100% 97%  Weight:      Height:       Weight change:   Intake/Output Summary (Last 24 hours) at 05/06/16 1456 Last data filed at 05/06/16 1428  Gross per 24 hour  Intake          2758.33 ml  Output             2700 ml  Net            58.33 ml     Exam: Heart:: RRR Lungs: normal Abdomen: soft, nontender, normal bowel sounds   Lab Results: @LABTEST2 @ Micro Results: Recent Results (from the past 240 hour(s))  Urine culture     Status: None   Collection Time: 05/05/16 10:20 AM  Result Value Ref Range Status   Specimen Description URINE, RANDOM  Final   Special Requests NONE  Final   [facility]   Final   Report Status 05/06/2016 FINAL  Final  C difficile quick scan w PCR reflex     Status: None   Collection Time: 05/05/16 12:59 PM  Result Value Ref Range Status   C Diff antigen NEGATIVE NEGATIVE Final   C Diff toxin NEGATIVE NEGATIVE Final   C Diff interpretation No C. difficile detected.  Final  Gastrointestinal Panel by PCR , Stool     Status: None   Collection Time: 05/05/16 12:59 PM  Result Value Ref Range Status   Campylobacter species NOT DETECTED NOT DETECTED Final   Plesimonas shigelloides NOT DETECTED NOT DETECTED Final   Salmonella species NOT DETECTED NOT DETECTED Final   Yersinia enterocolitica NOT DETECTED NOT DETECTED Final   Vibrio species NOT DETECTED NOT DETECTED Final   Vibrio cholerae NOT DETECTED NOT DETECTED Final   Enteroaggregative E coli (EAEC) NOT DETECTED NOT DETECTED Final   Enteropathogenic E coli (EPEC) NOT DETECTED NOT DETECTED Final   Enterotoxigenic E coli (ETEC) NOT DETECTED NOT DETECTED Final   Shiga like toxin producing E coli (STEC) NOT DETECTED NOT DETECTED Final   E. coli O157 NOT DETECTED NOT DETECTED Final   Shigella/Enteroinvasive E coli (EIEC) NOT DETECTED NOT DETECTED Final   Cryptosporidium NOT DETECTED NOT DETECTED Final   Cyclospora cayetanensis NOT DETECTED NOT DETECTED Final   Entamoeba histolytica NOT DETECTED NOT DETECTED Final   Giardia lamblia NOT DETECTED NOT DETECTED Final   Adenovirus F40/41 NOT DETECTED NOT DETECTED Final   Astrovirus NOT DETECTED NOT DETECTED Final   Norovirus GI/GII NOT DETECTED NOT DETECTED Final   Rotavirus A NOT DETECTED NOT DETECTED Final   Sapovirus (I, II, IV, and V) NOT DETECTED NOT DETECTED Final   Studies/Results: No results found. Medications: I have reviewed the patient's current  medications. Scheduled Meds: . budesonide  3 mg Oral Daily  . predniSONE  40 mg Oral QAC breakfast  . sertraline  100 mg Oral Daily  . tuberculin  5 Units Intradermal Once   Continuous Infusions: . 0.9 % NaCl with KCl 20 mEq / L 100 mL/hr at 05/06/16 1054   PRN Meds:.clonazepam, morphine injection, ondansetron (ZOFRAN) IV, oxyCODONE   Assessment: Principal Problem:   Acute pancreatitis Active Problems:   UTI (lower urinary tract infection)   Recurrent pancreatitis (HCC)    Plan: This patient is a 30 year old woman with ulcerative colitis and recurrent pancreatitis.  The patient will be set up for an MRCP to rule out pancreatic divisum.  The patient is on steroids and her mesalamine has been stopped.  I had spoke to the pharmacy and Humira  will be hard to obtain while the patient is in the hospital and will be started as an outpatient.  She has been explained the plan and agrees with it.   LOS: 1 day   [place] MiniumDarren Steadman Prosperi 05/06/2016, 2:56 PM

## 2016-05-07 ENCOUNTER — Inpatient Hospital Stay: Payer: BC Managed Care – PPO

## 2016-05-07 DIAGNOSIS — K859 Acute pancreatitis without necrosis or infection, unspecified: Principal | ICD-10-CM

## 2016-05-07 LAB — HEMOGLOBIN: HEMOGLOBIN: 10.8 g/dL — AB (ref 12.0–16.0)

## 2016-05-07 LAB — LIPASE, BLOOD: Lipase: 169 U/L — ABNORMAL HIGH (ref 11–51)

## 2016-05-07 MED ORDER — GADOBENATE DIMEGLUMINE 529 MG/ML IV SOLN
20.0000 mL | Freq: Once | INTRAVENOUS | Status: AC | PRN
  Administered 2016-05-07: 20 mL via INTRAVENOUS

## 2016-05-07 NOTE — Care Management (Signed)
Informed during progression there are no discharge needs at present

## 2016-05-07 NOTE — Progress Notes (Signed)
[facility] [place] NAME: Jo Perkins    MR#:  [id number]  DATE OF BIRTH:  1986/03/22  SUBJECTIVE:  CHIEF COMPLAINT:   Chief Complaint  Patient presents with  . Abdominal Pain  The patient is 30 year old Caucasian female with history of ulcerative colitis, recent admission for pancreatitis, who presents to the hospital with recurrent worsening abdominal pain, diarrhea and rectal bleeding.patient's initial labs were unremarkable. Patient was seen by gastroenterologist who suggested steroids orally and discontinue mesalamine due to concerns of pancreatitis. Patient feels better today, less pain, Tolerates clear liquid diet, no more rectal bleeding. Still intermittent diarrheal stool, last one last night, also earlier in the morning, large amount. Patient is for MRCP today to rule out pancreatic divisum. This right upper quadrant abdominal discomfort intermittently, not related to oral intake  Review of Systems  Constitutional: Negative for chills, fever and weight loss.  HENT: Negative for congestion.   Eyes: Negative for blurred vision and double vision.  Respiratory: Negative for cough, sputum production, shortness of breath and wheezing.   Cardiovascular: Negative for chest pain, palpitations, orthopnea, leg swelling and PND.  Gastrointestinal: Positive for abdominal pain, blood in stool and diarrhea. Negative for constipation, nausea and vomiting.  Genitourinary: Negative for dysuria, frequency, hematuria and urgency.  Musculoskeletal: Negative for falls.  Neurological: Negative for dizziness, tremors, focal weakness and headaches.  Endo/Heme/Allergies: Does not bruise/bleed easily.  Psychiatric/Behavioral: Negative for depression. The patient does not have insomnia.     VITAL SIGNS: Blood pressure (!) 98/59, pulse (!) 44, temperature 98.3 F (36.8 C), temperature source Oral, resp. rate 18, height 5' 9"  (1.753 m), weight 78.5 kg  (173 lb), last menstrual period 04/21/2016, SpO2 100 %.  PHYSICAL EXAMINATION:   GENERAL:  30 y.o.-year-old patient lying in the bed with no acute distress.  EYES: Pupils equal, round, reactive to light and accommodation. No scleral icterus. Extraocular muscles intact.  HEENT: Head atraumatic, normocephalic. Oropharynx and nasopharynx clear.  NECK:  Supple, no jugular venous distention. No thyroid enlargement, no tenderness.  LUNGS: Normal breath sounds bilaterally, no wheezing, rales,rhonchi or crepitation. No use of accessory muscles of respiration.  CARDIOVASCULAR: S1, S2 normal. No murmurs, rubs, or gallops.  ABDOMEN: Soft, nontender, nondistended. Bowel sounds present. No organomegaly or mass.  EXTREMITIES: No pedal edema, cyanosis, or clubbing.  NEUROLOGIC: Cranial nerves II through XII are intact. Muscle strength 5/5 in all extremities. Sensation intact. Gait not checked.  PSYCHIATRIC: The patient is alert and oriented x 3.  SKIN: No obvious rash, lesion, or ulcer.   ORDERS/RESULTS REVIEWED:   CBC  Recent Labs Lab 05/05/16 0716 05/06/16 0613 05/07/16 0607  WBC 10.0 8.2  --   HGB 12.7 10.9* 10.8*  HCT 37.6 32.4*  --   PLT 276 189  --   MCV 83.7 84.2  --   MCH 28.2 28.4  --   MCHC 33.7 33.8  --   RDW 14.3 14.1  --    ------------------------------------------------------------------------------------------------------------------  Chemistries   Recent Labs Lab 05/05/16 0716 05/06/16 0613  NA 137 135  K 4.0 3.7  CL 103 104  CO2 28 25  GLUCOSE 102* 82  BUN 8 6  CREATININE 0.56 0.64  CALCIUM 9.4 8.6*  AST 21 15  ALT 13* 10*  ALKPHOS 57 49  BILITOT 0.3 0.7   ------------------------------------------------------------------------------------------------------------------ estimated creatinine clearance is 108.4 mL/min (by C-G formula based on SCr of 0.8  mg/dL). ------------------------------------------------------------------------------------------------------------------ No results for input(s): TSH, T4TOTAL,  T3FREE, THYROIDAB in the last 72 hours.  Invalid input(s): FREET3  Cardiac Enzymes No results for input(s): CKMB, TROPONINI, MYOGLOBIN in the last 168 hours.  Invalid input(s): CK ------------------------------------------------------------------------------------------------------------------ Invalid input(s): POCBNP ---------------------------------------------------------------------------------------------------------------  RADIOLOGY: No results found.  EKG: No orders found for this or any previous visit.  ASSESSMENT AND PLAN:  Principal Problem:   Acute pancreatitis Active Problems:   UTI (lower urinary tract infection)   Recurrent pancreatitis ([place])  #1. Acute pancreatitis, off mesalamine, lipase has been improving, gastroenterologist feels that gallbladder Surgery may be warranted due to possible microlithiasis, for MRCP today. Decrease IV fluid rate, advance diet to full liquid, follow lipase level in the morning #2ulcerative colitis exacerbation, continue steroids orally, budesonide, patient is being prepped for Humira, likely as outpatient, per gastroenterologist. #3. Sterile pyuria, no growth in urine cultures, off antibiotic #4 acute posthemorrhagic anemia , remains stable on IV fluids, off Lovenox, follow hemoglobin level in the morning. No need to transfuse at present.  #5. Right upper quadrant pain, unclear etiology, abdominal ultrasound 13th of July 2017,  was unremarkable, patient will undergo MRCP today to rule out microlithiasis.  Management plans discussed with the patient, family and they are in agreement.   DRUG ALLERGIES:  Allergies  Allergen Reactions  . Imuran  [Azathioprine]     caused pancreatitis    CODE STATUS:     Code Status Orders        Start     Ordered   05/05/16 1014  Full  code  Continuous     05/05/16 1013    Code Status History    Date Active Date Inactive Code Status Order ID Comments User Context   03/30/2016  1:02 AM 03/31/2016  6:46 PM Full Code [id number]  Harrie Foreman, MD Inpatient      TOTAL TIME TAKING CARE OF THIS PATIENT: 30 minutes.    Theodoro Grist M.D on 05/07/2016 at 11:31 AM  Between 7am to 6pm - Pager - [phone]  After 6pm go to [email or web address] - password [email or web address] [facility]  Office  [phone]  CC: Primary care physician; Lelon Huh, MD

## 2016-05-07 NOTE — Progress Notes (Signed)
Jo Miniumarren Estiben Mizuno, MD [facility]   [address] [place], [place] [address] Phone: [phone] Fax : [phone]   Subjective: This patient has a history of ulcerative colitis and states that her last bowel movement was earlier this morning.  The patient is doing much better  In regards to her abdominal pain.  The patient reports that she would like to be discharged sooner than later.   Objective: Vital signs in last 24 hours: Vitals:   05/06/16 1258 05/06/16 2038 05/07/16 0612 05/07/16 1356  BP: 104/60 102/63 (!) 98/59 109/61  Pulse: (!) 55 62 (!) 44 (!) 49  Resp: 18 18 18 16   Temp: 98.3 F (36.8 C) 98.8 F (37.1 C) 98.3 F (36.8 C) 98.6 F (37 C)  TempSrc: Oral Oral Oral Oral  SpO2: 97% 99% 100% 100%  Weight:      Height:       Weight change:   Intake/Output Summary (Last 24 hours) at 05/07/16 1937 Last data filed at 05/07/16 0612  Gross per 24 hour  Intake          1073.33 ml  Output              950 ml  Net           123.33 ml     Exam: The patient is alert and orientated and in no apparent distress. Neurologically grossly intact   Lab Results: @LABTEST2 @ Micro Results: Recent Results (from the past 240 hour(s))  Urine culture     Status: None   Collection Time: 05/05/16 10:20 AM  Result Value Ref Range Status   Specimen Description URINE, RANDOM  Final   Special Requests NONE  Final   [facility]   Final   Report Status 05/06/2016 FINAL  Final  C difficile quick scan w PCR reflex     Status: None   Collection Time: 05/05/16 12:59 PM  Result Value Ref Range Status   C Diff antigen NEGATIVE NEGATIVE Final   C Diff toxin NEGATIVE NEGATIVE Final   C Diff interpretation No C. difficile detected.  Final  Gastrointestinal Panel by PCR , Stool     Status: None   Collection Time: 05/05/16 12:59 PM  Result Value Ref Range Status   Campylobacter species NOT DETECTED NOT DETECTED Final   Plesimonas shigelloides NOT  DETECTED NOT DETECTED Final   Salmonella species NOT DETECTED NOT DETECTED Final   Yersinia enterocolitica NOT DETECTED NOT DETECTED Final   Vibrio species NOT DETECTED NOT DETECTED Final   Vibrio cholerae NOT DETECTED NOT DETECTED Final   Enteroaggregative E coli (EAEC) NOT DETECTED NOT DETECTED Final   Enteropathogenic E coli (EPEC) NOT DETECTED NOT DETECTED Final   Enterotoxigenic E coli (ETEC) NOT DETECTED NOT DETECTED Final   Shiga like toxin producing E coli (STEC) NOT DETECTED NOT DETECTED Final   E. coli O157 NOT DETECTED NOT DETECTED Final   Shigella/Enteroinvasive E coli (EIEC) NOT DETECTED NOT DETECTED Final   Cryptosporidium NOT DETECTED NOT DETECTED Final   Cyclospora cayetanensis NOT DETECTED NOT DETECTED Final   Entamoeba histolytica NOT DETECTED NOT DETECTED Final   Giardia lamblia NOT DETECTED NOT DETECTED Final   Adenovirus F40/41 NOT DETECTED NOT DETECTED Final   Astrovirus NOT DETECTED NOT DETECTED Final   Norovirus GI/GII NOT DETECTED NOT DETECTED Final   Rotavirus A NOT DETECTED NOT DETECTED Final   Sapovirus (I, II, IV, and V) NOT DETECTED NOT DETECTED Final  Studies/Results: Mr 3d Recon At Scanner  Addendum Date: 05/07/2016   ADDENDUM REPORT: 05/07/2016 16:51 ADDENDUM: Exam title correction: MRI with without contrast and MRCP. CONTRAST:  Twenty mL MultiHance Additional FINDINGS: No evidence of pancreas divisum.  No evidence of acute pancreatitis. Findings conveyed toDARREN Amaria [facility] on 05/07/2016  at16:51. Electronically Signed   By: Jo [facility] M.D.   On: 05/07/2016 16:51   Result Date: 05/07/2016 CLINICAL DATA:  Evaluate indeterminate lesion adjacent the spleen EXAM: MRI ABDOMEN WITHOUT CONTRAST  (INCLUDING MRCP) TECHNIQUE: Multiplanar multisequence MR imaging of the abdomen was performed. Heavily T2-weighted images of the biliary and pancreatic ducts were obtained, and three-dimensional MRCP images were rendered by post processing. COMPARISON:  CT 03/28/2016  FINDINGS: Lower chest:  Lung bases are clear. Hepatobiliary: No focal hepatic lesion. Some sludge within the gallbladder. No gallbladder inflammation. Common bile duct normal caliber. Pancreas: Normal pancreatic parenchymal intensity. No ductal dilatation or inflammation. Spleen: Normal spleen. Small round lesion adjacent to the spleen measuring 16 mm (image 35, series 11) corresponds the indeterminate lesion on comparison CT. Lesion has equal signal intensity to spleen on series 11 and slightly differing intensity on the other sequences (series 6 and series 5 series 11). Lesion is slightly different than the adjacent renal parenchymal intensity. Lesion is equal density to the spleen on comparison CT. Overall evaluation favors a benign splenule. Adrenals/urinary tract: Adrenal glands are normal. Stomach/Bowel: Stomach and limited of the small bowel is unremarkable Vascular/Lymphatic: Abdominal aortic normal caliber. No retroperitoneal periportal lymphadenopathy. Musculoskeletal: No aggressive osseous lesion IMPRESSION: 1. Small round lesion adjacent to the spleen has imaging characteristics most consistent with a benign splenule. 2. Gallbladder sludge. Electronically Signed: By: Jo [facility] M.D. On: 05/07/2016 13:37   Mr Jo [facility]  Addendum Date: 05/07/2016   ADDENDUM REPORT: 05/07/2016 16:51 ADDENDUM: Exam title correction: MRI with without contrast and MRCP. CONTRAST:  Twenty mL MultiHance Additional FINDINGS: No evidence of pancreas divisum.  No evidence of acute pancreatitis. Findings conveyed toDARREN Kenslei [facility] on 05/07/2016  at16:51. Electronically Signed   By: Jo [facility] M.D.   On: 05/07/2016 16:51   Result Date: 05/07/2016 CLINICAL DATA:  Evaluate indeterminate lesion adjacent the spleen EXAM: MRI ABDOMEN WITHOUT CONTRAST  (INCLUDING MRCP) TECHNIQUE: Multiplanar multisequence MR imaging of the abdomen was performed. Heavily T2-weighted images of the biliary and pancreatic ducts were obtained,  and three-dimensional MRCP images were rendered by post processing. COMPARISON:  CT 03/28/2016 FINDINGS: Lower chest:  Lung bases are clear. Hepatobiliary: No focal hepatic lesion. Some sludge within the gallbladder. No gallbladder inflammation. Common bile duct normal caliber. Pancreas: Normal pancreatic parenchymal intensity. No ductal dilatation or inflammation. Spleen: Normal spleen. Small round lesion adjacent to the spleen measuring 16 mm (image 35, series 11) corresponds the indeterminate lesion on comparison CT. Lesion has equal signal intensity to spleen on series 11 and slightly differing intensity on the other sequences (series 6 and series 5 series 11). Lesion is slightly different than the adjacent renal parenchymal intensity. Lesion is equal density to the spleen on comparison CT. Overall evaluation favors a benign splenule. Adrenals/urinary tract: Adrenal glands are normal. Stomach/Bowel: Stomach and limited of the small bowel is unremarkable Vascular/Lymphatic: Abdominal aortic normal caliber. No retroperitoneal periportal lymphadenopathy. Musculoskeletal: No aggressive osseous lesion IMPRESSION: 1. Small round lesion adjacent to the spleen has imaging characteristics most consistent with a benign splenule. 2. Gallbladder sludge. Electronically Signed: By: Jo [facility] M.D. On: 05/07/2016 13:37   Medications: I have reviewed the patient's current medications.  Scheduled Meds: . budesonide  3 mg Oral Daily  . predniSONE  40 mg Oral QAC breakfast  . sertraline  100 mg Oral Daily   Continuous Infusions: . 0.9 % NaCl with KCl 20 mEq / L 50 mL/hr at 05/07/16 1133   PRN Meds:.clonazepam, morphine injection, ondansetron (ZOFRAN) IV, oxyCODONE   Assessment: Principal Problem:   Acute pancreatitis Active Problems:   UTI (lower urinary tract infection)   Recurrent pancreatitis (HCC)    Plan: This patient is here with recurrent pancreatitis.  The patient had her mesalamine stopped a  course of its association with pancreatitis.  The patient will follow up in the office and may need to be started on Humira.  Nothing further to do from a GI point of view during this hospital admission.  The patient should be continued on a steroid taper with 40 mg for 2 weeks and decreasing by 10 mg every 2 weeks.   LOS: 2 days   Jo MiniumDarren Tianna Baus 05/07/2016, 7:37 PM

## 2016-05-08 ENCOUNTER — Telehealth: Payer: Self-pay | Admitting: Family Medicine

## 2016-05-08 ENCOUNTER — Ambulatory Visit: Payer: Self-pay | Admitting: Gastroenterology

## 2016-05-08 DIAGNOSIS — D62 Acute posthemorrhagic anemia: Secondary | ICD-10-CM

## 2016-05-08 DIAGNOSIS — K5190 Ulcerative colitis, unspecified, without complications: Secondary | ICD-10-CM

## 2016-05-08 DIAGNOSIS — R1011 Right upper quadrant pain: Secondary | ICD-10-CM

## 2016-05-08 DIAGNOSIS — K828 Other specified diseases of gallbladder: Secondary | ICD-10-CM

## 2016-05-08 LAB — LIPASE, BLOOD: Lipase: 193 U/L — ABNORMAL HIGH (ref 11–51)

## 2016-05-08 MED ORDER — PREDNISONE 10 MG (21) PO TBPK: 10.0000 mg | ORAL_TABLET | Freq: Every day | ORAL | 0 refills | Status: DC

## 2016-05-08 MED ORDER — OXYCODONE HCL 5 MG PO TABS: 5.0000 mg | ORAL_TABLET | ORAL | 0 refills | Status: DC | PRN

## 2016-05-08 NOTE — Progress Notes (Signed)
05/08/2016 12:02 PM  BP (!) 90/51 (BP Location: Right Arm)   Pulse (!) 51   Temp 98 F (36.7 C) (Oral)   Resp 16   Ht 5\' 9"  (1.753 m)   Wt 78.5 kg (173 lb)   LMP 04/21/2016 (Approximate)   SpO2 98%   BMI 25.55 kg/m . Patient discharged per MD orders. Discharge instructions reviewed with patient and patient verbalized understanding. IV removed per policy. Prescriptions discussed and given to patient. Discharged via wheelchair escorted by volunteer.  Ron ParkerHerron, Allianna Beaubien D, RN

## 2016-05-08 NOTE — Telephone Encounter (Signed)
Pt is being discharged from [facility] today for acute pancreatitis.  I have schedule a hospital follow up appointment/MW

## 2016-05-08 NOTE — Telephone Encounter (Signed)
error 

## 2016-05-08 NOTE — Discharge Summary (Signed)
[facility] NAME: Jo Perkins    MR#:  [id number]  DATE OF BIRTH:  [birth year]  DATE OF ADMISSION:  05/05/2016 ADMITTING PHYSICIAN: Jo Grist, MD  DATE OF DISCHARGE: 05/08/2016 12:08 PM  PRIMARY CARE PHYSICIAN: Jo Huh, MD     ADMISSION DIAGNOSIS:  Acute pancreatitis, unspecified pancreatitis type [K85.9]  DISCHARGE DIAGNOSIS:  Principal Problem:   Acute pancreatitis Active Problems:   Recurrent pancreatitis ([place])   Sterile pyuria   Exacerbation of ulcerative colitis ([place])   Acute posthemorrhagic anemia   Gallbladder sludge   RUQ pain   SECONDARY DIAGNOSIS:   Past Medical History:  Diagnosis Date  . Depression   . History of colon polyps   . Panic attack   . Ulcerative colitis ([place])     .pro HOSPITAL COURSE:   The patient is 30 year old Caucasian female with history of ulcerative colitis, recent admission for pancreatitis, who presents to the hospital with recurrent worsening abdominal pain, diarrhea and rectal bleeding.The patient's initial labs were unremarkable. Patient was seen by gastroenterologist who suggested steroids orally and discontinue mesalamine due to concerns of recurrent pancreatitis. The patient was seen by gastroenterologist who recommended MRCP, MRCP revealed small round lesion adjacent to the spleen, consistent with benign splen not, gallbladder sludge. Patient was continued on IV fluids, pain medications and nothing by mouth, diet was slowly advanced as patient's condition improved. Patient's lipase improved to below 200s, her diet was advanced to full liquid diet and patient was recommended to continue the full liquid diet for the next few days and slowly advance to regular as tolerated. Gastroneurologist recommended patient to continue steroids and follow-up with him within 1 week after discharge. Discussion by problem: #1. Recurrent pancreatitis, off mesalamine due to concerns of  recurrency, lipase has been improving, gastroenterologist felt that gallbladder surgery may be warranted due to possible microlithiasis, MRCP revealed gallbladder sludge, patient is to follow up with gastroenterologist and decide on surgical consultation as outpatient. Patient was advised to continue full liquid diet and slowly advance to regular as tolerated. #2ulcerative colitis exacerbation, continue steroids orally, 40 mg daily dose orally for 2 weeks, then taper by 10 mg daily until seen by gastroenterologist, the  patient is being prepped for Humira to be started as outpatient, the patient is to follow-up with primary  gastroenterologist within 1-2 weeks after discharge. #3. Sterile pyuria, no growth in urine cultures, off antibiotic #4 acute posthemorrhagic anemia due to gastrointestinal bleed due to ulcerative colitis exacerbation, hemoglobin remained stable, the was no need to transfuse , it is recommended to follow hemoglobin level as outpatient, although patient's gastrointestinal bleeding has stopped.  #5. Right upper quadrant pain, colonic versus gallbladder, etiology remains still unclear, abdominal ultrasound 13th of July 2017,  was unremarkable, patient underwent MRCP which revealed microlithiasis, normal bile ducts, no obstruction, patient is to follow-up with gastroneurologist and decide about surgical consultation as outpatient DISCHARGE CONDITIONS:   Stable  CONSULTS OBTAINED:  Treatment Team:  Jo Lame, MD  DRUG ALLERGIES:   Allergies  Allergen Reactions  . Imuran  [Azathioprine]     caused pancreatitis    DISCHARGE MEDICATIONS:   Discharge Medication List as of 05/08/2016 11:48 AM    START taking these medications   Details  oxyCODONE (OXY IR/ROXICODONE) 5 MG immediate release tablet Take 1 tablet (5 mg total) by mouth every 4 (four) hours as needed for moderate pain., Starting Tue 05/08/2016, Print    predniSONE (STERAPRED UNI-PAK  21 TAB) 10 MG (21) TBPK tablet  Take 1 tablet (10 mg total) by mouth daily. Please take 4 pills daily in the morning for 2 weeks, then taper by 1 pill every 2 weeks, thank you, Starting Tue 05/08/2016, Normal      CONTINUE these medications which have NOT CHANGED   Details  clonazePAM (KLONOPIN) 0.5 MG tablet 1/2-1 tablet every four hours as needed., Print    sertraline (ZOLOFT) 100 MG tablet TAKE 1 TABLET BY MOUTH EVERY DAY, Normal      STOP taking these medications     HYDROcodone-acetaminophen (NORCO) 5-325 MG tablet      Mesalamine (ASACOL) 400 MG CPDR DR capsule          DISCHARGE INSTRUCTIONS:    Patient is to follow-up with primary care physician, gastroenterologist within 1-2 weeks after discharge  If you experience worsening of your admission symptoms, develop shortness of breath, life threatening emergency, suicidal or homicidal thoughts you must seek medical attention immediately by calling 911 or calling your MD immediately  if symptoms less severe.  You Must read complete instructions/literature along with all the possible adverse reactions/side effects for all the Medicines you take and that have been prescribed to you. Take any new Medicines after you have completely understood and accept all the possible adverse reactions/side effects.   Please note  You were cared for by a hospitalist during your hospital stay. If you have any questions about your discharge medications or the care you received while you were in the hospital after you are discharged, you can call the unit and asked to speak with the hospitalist on call if the hospitalist that took care of you is not available. Once you are discharged, your primary care physician will handle any further medical issues. Please note that NO REFILLS for any discharge medications will be authorized once you are discharged, as it is imperative that you return to your primary care physician (or establish a relationship with a primary care physician if you do  not have one) for your aftercare needs so that they can reassess your need for medications and monitor your lab values.    Today   CHIEF COMPLAINT:   Chief Complaint  Patient presents with  . Abdominal Pain    HISTORY OF PRESENT ILLNESS:  Jo Podgorski  is a 30 y.o. female with a known history of ulcerative colitis, recent admission for pancreatitis, who presents to the hospital with recurrent worsening abdominal pain, diarrhea and rectal bleeding.The patient's initial labs were unremarkable. Patient was seen by gastroenterologist who suggested steroids orally and discontinue mesalamine due to concerns of recurrent pancreatitis. The patient was seen by gastroenterologist who recommended MRCP, MRCP revealed small round lesion adjacent to the spleen, consistent with benign splen not, gallbladder sludge. Patient was continued on IV fluids, pain medications and nothing by mouth, diet was slowly advanced as patient's condition improved. Patient's lipase improved to below 200s, her diet was advanced to full liquid diet and patient was recommended to continue the full liquid diet for the next few days and slowly advance to regular as tolerated. Gastroneurologist recommended patient to continue steroids and follow-up with him within 1 week after discharge. Discussion by problem: #1. 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 Patient was advised to  continue full liquid diet and slowly advance to regular as tolerated. #2ulcerative colitis exacerbation, continue steroids orally, 40 mg daily dose orally for 2 weeks, then taper by 10 mg daily until seen by gastroenterologist, the  patient is being prepped for Humira to be started as  outpatient, the patient is to follow-up with primary  gastroenterologist within 1-2 weeks after discharge. #3. Sterile pyuria, no growth in urine cultures, off antibiotic #4 acute posthemorrhagic anemia due to gastrointestinal bleed due to ulcerative colitis exacerbation, hemoglobin remained stable, the was no need to transfuse , it is recommended to follow hemoglobin level as outpatient, although patient's gastrointestinal bleeding has stopped.  #5. Right upper quadrant pain, colonic versus gallbladder, etiology remains still unclear, abdominal ultrasound 13th of July 2017,  was unremarkable, patient underwent MRCP which revealed microlithiasis, normal bile ducts, no obstruction, patient is to follow-up with gastroneurologist and decide about surgical consultation as outpatient   VITAL SIGNS:  Blood pressure (!) 90/51, pulse (!) 51, temperature 98 F (36.7 C), temperature source Oral, resp. rate 16, height 5' 9"  (1.753 m), weight 78.5 kg (173 lb), last menstrual period 04/21/2016, SpO2 98 %.  I/O:   Intake/Output Summary (Last 24 hours) at 05/08/16 1221 Last data filed at 05/08/16 0950  Gross per 24 hour  Intake             1530 ml  Output             2575 ml  Net            -1045 ml    PHYSICAL EXAMINATION:  GENERAL:  30 y.o.-year-old patient lying in the bed with no acute distress.  EYES: Pupils equal, round, reactive to light and accommodation. No scleral icterus. Extraocular muscles intact.  HEENT: Head atraumatic, normocephalic. Oropharynx and nasopharynx clear.  NECK:  Supple, no jugular venous distention. No thyroid enlargement, no tenderness.  LUNGS: Normal breath sounds bilaterally, no wheezing, rales,rhonchi or crepitation. No use of accessory muscles of respiration.  CARDIOVASCULAR: S1, S2 normal. No murmurs, rubs, or gallops.  ABDOMEN: Soft, non-tender, non-distended. Bowel sounds present. No organomegaly or mass.  EXTREMITIES: No pedal edema, cyanosis, or clubbing.   NEUROLOGIC: Cranial nerves II through XII are intact. Muscle strength 5/5 in all extremities. Sensation intact. Gait not checked.  PSYCHIATRIC: The patient is alert and oriented x 3.  SKIN: No obvious rash, lesion, or ulcer.   DATA REVIEW:   CBC  Recent Labs Lab 05/06/16 0613 05/07/16 0607  WBC 8.2  --   HGB 10.9* 10.8*  HCT 32.4*  --   PLT 189  --     Chemistries   Recent Labs Lab 05/06/16 0613  NA 135  K 3.7  CL 104  CO2 25  GLUCOSE 82  BUN 6  CREATININE 0.64  CALCIUM 8.6*  AST 15  ALT 10*  ALKPHOS 49  BILITOT 0.7    Cardiac Enzymes No results for input(s): TROPONINI in the last 168 hours.  Microbiology Results  Results for orders placed or performed during the hospital encounter of 05/05/16  Urine culture     Status: None   Collection Time: 05/05/16 10:20 AM  Result Value Ref Range Status   Specimen Description URINE, RANDOM  Final   Special Requests NONE  Final   Culture NO GROWTH Performed at [facility]   Final   Report Status 05/06/2016 FINAL  Final  C difficile quick scan w PCR reflex     Status: None  Collection Time: 05/05/16 12:59 PM  Result Value Ref Range Status   C Diff antigen NEGATIVE NEGATIVE Final   C Diff toxin NEGATIVE NEGATIVE Final   C Diff interpretation No C. difficile detected.  Final  Gastrointestinal Panel by PCR , Stool     Status: None   Collection Time: 05/05/16 12:59 PM  Result Value Ref Range Status   Campylobacter species NOT DETECTED NOT DETECTED Final   Plesimonas shigelloides NOT DETECTED NOT DETECTED Final   Salmonella species NOT DETECTED NOT DETECTED Final   Yersinia enterocolitica NOT DETECTED NOT DETECTED Final   Vibrio species NOT DETECTED NOT DETECTED Final   Vibrio cholerae NOT DETECTED NOT DETECTED Final   Enteroaggregative E coli (EAEC) NOT DETECTED NOT DETECTED Final   Enteropathogenic E coli (EPEC) NOT DETECTED NOT DETECTED Final   Enterotoxigenic E coli (ETEC) NOT DETECTED NOT DETECTED  Final   Shiga like toxin producing E coli (STEC) NOT DETECTED NOT DETECTED Final   E. coli O157 NOT DETECTED NOT DETECTED Final   Shigella/Enteroinvasive E coli (EIEC) NOT DETECTED NOT DETECTED Final   Cryptosporidium NOT DETECTED NOT DETECTED Final   Cyclospora cayetanensis NOT DETECTED NOT DETECTED Final   Entamoeba histolytica NOT DETECTED NOT DETECTED Final   Giardia lamblia NOT DETECTED NOT DETECTED Final   Adenovirus F40/41 NOT DETECTED NOT DETECTED Final   Astrovirus NOT DETECTED NOT DETECTED Final   Norovirus GI/GII NOT DETECTED NOT DETECTED Final   Rotavirus A NOT DETECTED NOT DETECTED Final   Sapovirus (I, II, IV, and V) NOT DETECTED NOT DETECTED Final    RADIOLOGY:  Mr 3d Recon At [facility]  Addendum Date: 05/07/2016   ADDENDUM REPORT: 05/07/2016 16:51 ADDENDUM: Exam title correction: MRI with without contrast and MRCP. CONTRAST:  Twenty mL MultiHance Additional FINDINGS: No evidence of pancreas divisum.  No evidence of acute pancreatitis. Findings conveyed toDARREN WOHL on 05/07/2016  at16:51. Electronically Signed   By: Suzy Bouchard M.D.   On: 05/07/2016 16:51   Result Date: 05/07/2016 CLINICAL DATA:  Evaluate indeterminate lesion adjacent the spleen EXAM: MRI ABDOMEN WITHOUT CONTRAST  (INCLUDING MRCP) TECHNIQUE: Multiplanar multisequence MR imaging of the abdomen was performed. Heavily T2-weighted images of the biliary and pancreatic ducts were obtained, and three-dimensional MRCP images were rendered by post processing. COMPARISON:  CT 03/28/2016 FINDINGS: Lower chest:  Lung bases are clear. Hepatobiliary: No focal hepatic lesion. Some sludge within the gallbladder. No gallbladder inflammation. Common bile duct normal caliber. Pancreas: Normal pancreatic parenchymal intensity. No ductal dilatation or inflammation. Spleen: Normal spleen. Small round lesion adjacent to the spleen measuring 16 mm (image 35, series 11) corresponds the indeterminate lesion on comparison CT. Lesion has  equal signal intensity to spleen on series 11 and slightly differing intensity on the other sequences (series 6 and series 5 series 11). Lesion is slightly different than the adjacent renal parenchymal intensity. Lesion is equal density to the spleen on comparison CT. Overall evaluation favors a benign splenule. Adrenals/urinary tract: Adrenal glands are normal. Stomach/Bowel: Stomach and limited of the small bowel is unremarkable Vascular/Lymphatic: Abdominal aortic normal caliber. No retroperitoneal periportal lymphadenopathy. Musculoskeletal: No aggressive osseous lesion IMPRESSION: 1. Small round lesion adjacent to the spleen has imaging characteristics most consistent with a benign splenule. 2. Gallbladder sludge. Electronically Signed: By: Suzy Bouchard M.D. On: 05/07/2016 13:37   Mr Lambert Mody Cm/mrcp  Addendum Date: 05/07/2016   ADDENDUM REPORT: 05/07/2016 16:51 ADDENDUM: Exam title correction: MRI with without contrast and MRCP. CONTRAST:  Twenty mL MultiHance  Additional FINDINGS: No evidence of pancreas divisum.  No evidence of acute pancreatitis. Findings conveyed toDARREN WOHL on 05/07/2016  at16:51. Electronically Signed   By: Suzy Bouchard M.D.   On: 05/07/2016 16:51   Result Date: 05/07/2016 CLINICAL DATA:  Evaluate indeterminate lesion adjacent the spleen EXAM: MRI ABDOMEN WITHOUT CONTRAST  (INCLUDING MRCP) TECHNIQUE: Multiplanar multisequence MR imaging of the abdomen was performed. Heavily T2-weighted images of the biliary and pancreatic ducts were obtained, and three-dimensional MRCP images were rendered by post processing. COMPARISON:  CT 03/28/2016 FINDINGS: Lower chest:  Lung bases are clear. Hepatobiliary: No focal hepatic lesion. Some sludge within the gallbladder. No gallbladder inflammation. Common bile duct normal caliber. Pancreas: Normal pancreatic parenchymal intensity. No ductal dilatation or inflammation. Spleen: Normal spleen. Small round lesion adjacent to the spleen measuring  16 mm (image 35, series 11) corresponds the indeterminate lesion on comparison CT. Lesion has equal signal intensity to spleen on series 11 and slightly differing intensity on the other sequences (series 6 and series 5 series 11). Lesion is slightly different than the adjacent renal parenchymal intensity. Lesion is equal density to the spleen on comparison CT. Overall evaluation favors a benign splenule. Adrenals/urinary tract: Adrenal glands are normal. Stomach/Bowel: Stomach and limited of the small bowel is unremarkable Vascular/Lymphatic: Abdominal aortic normal caliber. No retroperitoneal periportal lymphadenopathy. Musculoskeletal: No aggressive osseous lesion IMPRESSION: 1. Small round lesion adjacent to the spleen has imaging characteristics most consistent with a benign splenule. 2. Gallbladder sludge. Electronically Signed: By: Suzy Bouchard M.D. On: 05/07/2016 13:37    EKG:  No orders found for this or any previous visit.    Management plans discussed with the patient, family and they are in agreement.  CODE STATUS:     Code Status Orders        Start     Ordered   05/05/16 1014  Full code  Continuous     05/05/16 1013    Code Status History    Date Active Date Inactive Code Status Order ID Comments User Context   03/30/2016  1:02 AM 03/31/2016  6:46 PM Full Code [id number]  Harrie Foreman, MD Inpatient      TOTAL TIME TAKING CARE OF THIS PATIENT: 40 minutes.    Jo Perkins M.D on 05/08/2016 at 12:21 PM  Between 7am to 6pm - Pager - [phone]  After 6pm go to [email or web address] - password [email or web address]  Office  [phone]  CC: Primary care physician; Jo Huh, MD

## 2016-05-14 ENCOUNTER — Ambulatory Visit (INDEPENDENT_AMBULATORY_CARE_PROVIDER_SITE_OTHER): Payer: BC Managed Care – PPO | Admitting: Gastroenterology

## 2016-05-14 ENCOUNTER — Encounter: Payer: Self-pay | Admitting: Gastroenterology

## 2016-05-14 VITALS — BP 113/68 | HR 74 | Temp 98.4°F | Ht 69.0 in | Wt 173.0 lb

## 2016-05-14 DIAGNOSIS — K5190 Ulcerative colitis, unspecified, without complications: Secondary | ICD-10-CM

## 2016-05-14 NOTE — Progress Notes (Signed)
Primary Care Physician: Mila Merryonald Fisher, MD  Primary Gastroenterologist:  Dr. [name] Piedad Standiford  Chief Complaint  Patient presents with  . Hospitalization Follow-up    HPI: Jo Perkins is a 30 y.o. female here for follow-up after being discharged from the hospital.  The patient has had idiopathic pancreatitis and all of her medications that may cause Pancreatitis including Imuran and mesalamine have been stopped.  The Imuran was stopped many years ago and she recently stopped her mesalamine.  The patient was on these medications for her ulcerative colitis. The patient is now on a steroid taper and states that her stools are solid and she is no longer having rectal bleeding.  Current Outpatient Prescriptions  Medication Sig Dispense Refill  . clonazePAM (KLONOPIN) 0.5 MG tablet 1/2-1 tablet every four hours as needed. (Patient taking differently: Take 0.5 mg by mouth 2 (two) times daily. ) 30 tablet 1  . predniSONE (STERAPRED UNI-PAK 21 TAB) 10 MG (21) TBPK tablet Take 1 tablet (10 mg total) by mouth daily. Please take 4 pills daily in the morning for 2 weeks, then taper by 1 pill every 2 weeks, thank you 980 tablet 0  . sertraline (ZOLOFT) 100 MG tablet TAKE 1 TABLET BY MOUTH EVERY DAY (Patient taking differently: TAKE 1 TABLET BY MOUTH EVERY MORNING.) 30 tablet 11  . oxyCODONE (OXY IR/ROXICODONE) 5 MG immediate release tablet Take 1 tablet (5 mg total) by mouth every 4 (four) hours as needed for moderate pain. (Patient not taking: Reported on 05/14/2016) 30 tablet 0   No current facility-administered medications for this visit.     Allergies as of 05/14/2016 - Review Complete 05/14/2016  Allergen Reaction Noted  . Imuran  [azathioprine]  03/16/2015    ROS:  General: Negative for anorexia, weight loss, fever, chills, fatigue, weakness. ENT: Negative for hoarseness, difficulty swallowing , nasal congestion. CV: Negative for chest pain, angina, palpitations, dyspnea on exertion, peripheral  edema.  Respiratory: Negative for dyspnea at rest, dyspnea on exertion, cough, sputum, wheezing.  GI: See history of present illness. GU:  Negative for dysuria, hematuria, urinary incontinence, urinary frequency, nocturnal urination.  Endo: Negative for unusual weight change.    Physical Examination:   BP 113/68   Pulse 74   Temp 98.4 F (36.9 C) (Oral)   Ht 5\' 9"  (1.753 m)   Wt 173 lb (78.5 kg)   LMP 04/21/2016 (Approximate)   BMI 25.55 kg/m   General: Well-nourished, well-developed in no acute distress.  Eyes: No icterus. Conjunctivae pink. Extremities: No lower extremity edema. No clubbing or deformities. Neuro: Alert and oriented x 3.  Grossly intact. Skin: Warm and dry, no jaundice.   Psych: Alert and cooperative, normal mood and affect.  Labs:    Imaging Studies: Mr 3d Recon At Scanner  Addendum Date: 05/07/2016   ADDENDUM REPORT: 05/07/2016 16:51 ADDENDUM: Exam title correction: MRI with without contrast and MRCP. CONTRAST:  Twenty mL MultiHance Additional FINDINGS: No evidence of pancreas divisum.  No evidence of acute pancreatitis. Findings conveyed toDARREN Chole Driver on 05/07/2016  at16:51. Electronically Signed   By: Genevive BiStewart  Edmunds M.D.   On: 05/07/2016 16:51   Result Date: 05/07/2016 CLINICAL DATA:  Evaluate indeterminate lesion adjacent the spleen EXAM: MRI ABDOMEN WITHOUT CONTRAST  (INCLUDING MRCP) TECHNIQUE: Multiplanar multisequence MR imaging of the abdomen was performed. Heavily T2-weighted images of the biliary and pancreatic ducts were obtained, and three-dimensional MRCP images were rendered by post processing. COMPARISON:  CT 03/28/2016 FINDINGS: Lower chest:  Lung bases  are clear. Hepatobiliary: No focal hepatic lesion. Some sludge within the gallbladder. No gallbladder inflammation. Common bile duct normal caliber. Pancreas: Normal pancreatic parenchymal intensity. No ductal dilatation or inflammation. Spleen: Normal spleen. Small round lesion adjacent to the  spleen measuring 16 mm (image 35, series 11) corresponds the indeterminate lesion on comparison CT. Lesion has equal signal intensity to spleen on series 11 and slightly differing intensity on the other sequences (series 6 and series 5 series 11). Lesion is slightly different than the adjacent renal parenchymal intensity. Lesion is equal density to the spleen on comparison CT. Overall evaluation favors a benign splenule. Adrenals/urinary tract: Adrenal glands are normal. Stomach/Bowel: Stomach and limited of the small bowel is unremarkable Vascular/Lymphatic: Abdominal aortic normal caliber. No retroperitoneal periportal lymphadenopathy. Musculoskeletal: No aggressive osseous lesion IMPRESSION: 1. Small round lesion adjacent to the spleen has imaging characteristics most consistent with a benign splenule. 2. Gallbladder sludge. Electronically Signed: By: Genevive Bi M.D. On: 05/07/2016 13:37   Mr Jorja Loa Cm/mrcp  Addendum Date: 05/07/2016   ADDENDUM REPORT: 05/07/2016 16:51 ADDENDUM: Exam title correction: MRI with without contrast and MRCP. CONTRAST:  Twenty mL MultiHance Additional FINDINGS: No evidence of pancreas divisum.  No evidence of acute pancreatitis. Findings conveyed toDARREN Adelynne Joerger on 05/07/2016  at16:51. Electronically Signed   By: Genevive Bi M.D.   On: 05/07/2016 16:51   Result Date: 05/07/2016 CLINICAL DATA:  Evaluate indeterminate lesion adjacent the spleen EXAM: MRI ABDOMEN WITHOUT CONTRAST  (INCLUDING MRCP) TECHNIQUE: Multiplanar multisequence MR imaging of the abdomen was performed. Heavily T2-weighted images of the biliary and pancreatic ducts were obtained, and three-dimensional MRCP images were rendered by post processing. COMPARISON:  CT 03/28/2016 FINDINGS: Lower chest:  Lung bases are clear. Hepatobiliary: No focal hepatic lesion. Some sludge within the gallbladder. No gallbladder inflammation. Common bile duct normal caliber. Pancreas: Normal pancreatic parenchymal intensity.  No ductal dilatation or inflammation. Spleen: Normal spleen. Small round lesion adjacent to the spleen measuring 16 mm (image 35, series 11) corresponds the indeterminate lesion on comparison CT. Lesion has equal signal intensity to spleen on series 11 and slightly differing intensity on the other sequences (series 6 and series 5 series 11). Lesion is slightly different than the adjacent renal parenchymal intensity. Lesion is equal density to the spleen on comparison CT. Overall evaluation favors a benign splenule. Adrenals/urinary tract: Adrenal glands are normal. Stomach/Bowel: Stomach and limited of the small bowel is unremarkable Vascular/Lymphatic: Abdominal aortic normal caliber. No retroperitoneal periportal lymphadenopathy. Musculoskeletal: No aggressive osseous lesion IMPRESSION: 1. Small round lesion adjacent to the spleen has imaging characteristics most consistent with a benign splenule. 2. Gallbladder sludge. Electronically Signed: By: Genevive Bi M.D. On: 05/07/2016 13:37    Assessment and Plan:   EMPRESS NEWMANN is a 30 y.o. y/o female who was recently in the hospital for pancreatitis and a flare of her ulcerative colitis. The patient states that she is doing well at the present time without any symptoms.  The patient will continue her 2 weeks of prednisone at 40 mg and then go down to 30 mg after she completes the 2 weeks of 40 mg.  The patient will also be started on you marrow.  The patient has been explained the risks of the marry including MS. The patient agrees to starting a new medication.   Note: This dictation was prepared with Dragon dictation along with smaller phrase technology. Any transcriptional errors that result from this process are unintentional.

## 2016-05-17 ENCOUNTER — Other Ambulatory Visit: Payer: Self-pay

## 2016-05-17 MED ORDER — PREDNISONE 10 MG (21) PO TBPK: 10.0000 mg | ORAL_TABLET | Freq: Every day | ORAL | 0 refills | Status: DC

## 2016-05-17 MED ORDER — PREDNISONE 10 MG (21) PO TBPK: ORAL_TABLET | ORAL | 0 refills | Status: DC

## 2016-05-21 ENCOUNTER — Emergency Department: Payer: BC Managed Care – PPO

## 2016-05-21 ENCOUNTER — Encounter: Payer: Self-pay | Admitting: Emergency Medicine

## 2016-05-21 ENCOUNTER — Emergency Department
Admission: EM | Admit: 2016-05-21 | Discharge: 2016-05-22 | Disposition: A | Discharge status: Home / Self Care | Payer: BC Managed Care – PPO | Attending: Emergency Medicine | Admitting: Emergency Medicine

## 2016-05-21 DIAGNOSIS — R1010 Upper abdominal pain, unspecified: Secondary | ICD-10-CM | POA: Diagnosis present

## 2016-05-21 DIAGNOSIS — R109 Unspecified abdominal pain: Secondary | ICD-10-CM

## 2016-05-21 DIAGNOSIS — N9489 Other specified conditions associated with female genital organs and menstrual cycle: Secondary | ICD-10-CM | POA: Insufficient documentation

## 2016-05-21 DIAGNOSIS — K8500 Idiopathic acute pancreatitis without necrosis or infection: Secondary | ICD-10-CM | POA: Diagnosis not present

## 2016-05-21 LAB — CBC
HCT: 39.3 % (ref 35.0–47.0)
HEMOGLOBIN: 13 g/dL (ref 12.0–16.0)
MCH: 28 pg (ref 26.0–34.0)
MCHC: 33 g/dL (ref 32.0–36.0)
MCV: 84.7 fL (ref 80.0–100.0)
Platelets: 319 10*3/uL (ref 150–440)
RBC: 4.64 MIL/uL (ref 3.80–5.20)
RDW: 14.1 % (ref 11.5–14.5)
WBC: 11.4 10*3/uL — ABNORMAL HIGH (ref 3.6–11.0)

## 2016-05-21 LAB — URINALYSIS COMPLETE WITH MICROSCOPIC (ARMC ONLY)
BACTERIA UA: NONE SEEN
Bilirubin Urine: NEGATIVE
Glucose, UA: NEGATIVE mg/dL
Ketones, ur: NEGATIVE mg/dL
NITRITE: NEGATIVE
PH: 7 (ref 5.0–8.0)
Protein, ur: 30 mg/dL — AB
Specific Gravity, Urine: 1.005 (ref 1.005–1.030)

## 2016-05-21 LAB — COMPREHENSIVE METABOLIC PANEL
ALBUMIN: 4.1 g/dL (ref 3.5–5.0)
ALK PHOS: 53 U/L (ref 38–126)
ALT: 16 U/L (ref 14–54)
ANION GAP: 7 (ref 5–15)
AST: 20 U/L (ref 15–41)
BUN: 13 mg/dL (ref 6–20)
CHLORIDE: 100 mmol/L — AB (ref 101–111)
CO2: 28 mmol/L (ref 22–32)
Calcium: 9.3 mg/dL (ref 8.9–10.3)
Creatinine, Ser: 0.87 mg/dL (ref 0.44–1.00)
GFR calc Af Amer: >60 mL/min (ref >60)
GFR calc non Af Amer: >60 mL/min (ref >60)
GLUCOSE: 96 mg/dL (ref 65–99)
POTASSIUM: 4.1 mmol/L (ref 3.5–5.1)
SODIUM: 135 mmol/L (ref 135–145)
Total Bilirubin: 0.2 mg/dL — ABNORMAL LOW (ref 0.3–1.2)
Total Protein: 7.7 g/dL (ref 6.5–8.1)

## 2016-05-21 LAB — LIPASE, BLOOD: Lipase: 346 U/L — ABNORMAL HIGH (ref 11–51)

## 2016-05-21 LAB — HCG, QUANTITATIVE, PREGNANCY

## 2016-05-21 LAB — SEDIMENTATION RATE: SED RATE: 15 mm/h (ref 0–20)

## 2016-05-21 MED ORDER — SODIUM CHLORIDE 0.9 % IV BOLUS (SEPSIS)
1000.0000 mL | Freq: Once | INTRAVENOUS | Status: AC
  Administered 2016-05-21: 1000 mL via INTRAVENOUS

## 2016-05-21 MED ORDER — MORPHINE SULFATE (PF) 4 MG/ML IV SOLN
4.0000 mg | Freq: Once | INTRAVENOUS | Status: AC
  Administered 2016-05-21: 4 mg via INTRAVENOUS
  Filled 2016-05-21: qty 1

## 2016-05-21 MED ORDER — ACETAMINOPHEN 500 MG PO TABS
1000.0000 mg | ORAL_TABLET | ORAL | Status: AC
  Administered 2016-05-21: 1000 mg via ORAL
  Filled 2016-05-21: qty 2

## 2016-05-21 MED ORDER — MORPHINE SULFATE (PF) 4 MG/ML IV SOLN
4.0000 mg | Freq: Once | INTRAVENOUS | Status: AC
Start: 2016-05-21 — End: 2016-05-21
  Administered 2016-05-21: 4 mg via INTRAVENOUS
  Filled 2016-05-21: qty 1

## 2016-05-21 MED ORDER — ONDANSETRON HCL 4 MG/2ML IJ SOLN
4.0000 mg | Freq: Once | INTRAMUSCULAR | Status: AC
  Administered 2016-05-21: 4 mg via INTRAVENOUS
  Filled 2016-05-21: qty 2

## 2016-05-21 NOTE — ED Triage Notes (Signed)
Pt presents to ED with c/o stabbing epigastric pain since afternoon today, associated with light diarrhea. Denies nausea or vomiting.

## 2016-05-21 NOTE — ED Notes (Signed)
Patient transported to US 

## 2016-05-21 NOTE — Discharge Instructions (Signed)
You were seen in the emergency room for abdominal pain and appear to have pancreatitis again. It is important that you follow up closely with your primary gastroenterology doctor (Dr. Allen Norris) in the next couple of days.  Please return to the emergency room right away if you are to develop a fever, severe nausea, your pain becomes severe or worsens, you are unable to keep food down, begin vomiting any dark or bloody fluid, you develop any dark or bloody stools, feel dehydrated, or other new concerns or symptoms arise.

## 2016-05-21 NOTE — ED Provider Notes (Signed)
[facility] Emergency Department Provider Note   ____________________________________________   First MD Initiated Contact with Patient 05/21/16 2012     (approximate)  I have reviewed the triage vital signs and the nursing notes.   HISTORY  Chief Complaint Abdominal Pain    HPI Jo Perkins is a 30 y.o. female history of recent pancreatitis thought secondary to possible mesalamine which she is off Also history avulsion of colitis. Patient was recently admitted and an MRCP, GI consultation, also felt to have acute pancreatitis with all stiff colitis exacerbation  The patient reports that at about noon today she began experiencing mid upper abdominal pain shooting to her back she describes as moderate to severe in intensity and feeling like her previous "pancreatitis". She is currently not taking any medication except steroid taper.  She is currently on her period, but reports no heavy bleeding, no lower abdominal pain, all pain is in her upper abdomen and radiates through the back.  No chest pain or shortness of breath. No fevers. 2 somewhat loose stools but no blood or Stadel stool today     Past Medical History:  Diagnosis Date  . Depression   . History of colon polyps   . Panic attack   . Ulcerative colitis [facility])     Patient Active Problem List   Diagnosis Date Noted  . Exacerbation of ulcerative colitis ([place]) 05/08/2016  . Acute posthemorrhagic anemia 05/08/2016  . Gallbladder sludge 05/08/2016  . RUQ pain 05/08/2016  . Acute pancreatitis 05/05/2016  . Sterile pyuria 05/05/2016  . Recurrent pancreatitis ([place]) 05/05/2016  . Renal lesion 04/23/2016  . Pancreatitis 03/30/2016  . Pancreatitis, acute   . Elevated lipase   . History of colon polyps 03/16/2015  . Panic 03/16/2015  . Colitis gravis ([place]) 03/16/2015    Past Surgical History:  Procedure Laterality Date  . none      Prior to Admission medications   Medication Sig Start  Date End Date Taking? Authorizing Provider  clonazePAM (KLONOPIN) 0.5 MG tablet 1/2-1 tablet every four hours as needed. Patient taking differently: Take 0.5 mg by mouth 2 (two) times daily.  02/08/16   Birdie Sons, MD  oxyCODONE (OXY IR/ROXICODONE) 5 MG immediate release tablet Take 1 tablet (5 mg total) by mouth every 4 (four) hours as needed for moderate pain. Patient not taking: Reported on 05/14/2016 05/08/16   Theodoro Grist, MD  predniSONE (STERAPRED UNI-PAK 21 TAB) 10 MG (21) TBPK tablet Take 2 pills daily (51m) in the morning for 2 weeks, then taper by 1 pill daily (10 mg) for  2 weeks. 05/17/16   DLucilla Lame MD  sertraline (ZOLOFT) 100 MG tablet TAKE 1 TABLET BY MOUTH EVERY DAY Patient taking differently: TAKE 1 TABLET BY MOUTH EVERY MORNING. 03/05/16   DBirdie Sons MD    Allergies Imuran  [azathioprine]  Family History  Problem Relation Age of Onset  . Diabetes Maternal Grandmother     type 2  . Stroke Maternal Grandmother   . Heart attack Maternal Grandmother   . Lung cancer Maternal Grandfather     Social History Social History  Substance Use Topics  . Smoking status: Never Smoker  . Smokeless tobacco: Never Used  . Alcohol use 0.0 oz/week     Comment: rare    Review of Systems Constitutional: No fever/chills Eyes: No visual changes. ENT: No sore throat. Cardiovascular: Denies chest pain. Respiratory: Denies shortness of breath. Gastrointestinal: Nausea but no vomiting. No diarrhea.  No constipation. Genitourinary: Negative for dysuria. Musculoskeletal: Negative for back pain. Skin: Negative for rash. Neurological: Negative for headaches, focal weakness or numbness.  Denies pregnancy. Currently on her cycle, started yesterday.  10-point ROS otherwise negative.  ____________________________________________   PHYSICAL EXAM:  VITAL SIGNS: ED Triage Vitals  Enc Vitals Group     BP 05/21/16 1948 111/81     Pulse Rate 05/21/16 1948 95     Resp  05/21/16 1948 17     Temp 05/21/16 1948 98.5 F (36.9 C)     Temp Source 05/21/16 1948 Oral     SpO2 05/21/16 1948 97 %     Weight 05/21/16 1949 173 lb (78.5 kg)     Height 05/21/16 1949 5' 9"  (1.753 m)     Head Circumference --      Peak Flow --      Pain Score 05/21/16 2005 8     Pain Loc --      Pain Edu? --      Excl. in [place]? --     Constitutional: Alert and oriented. Well appearing and in no acute distress. Eyes: Conjunctivae are normal. PERRL. EOMI. Head: Atraumatic. Nose: No congestion/rhinnorhea. Mouth/Throat: Mucous membranes are moist.  Oropharynx non-erythematous. Neck: No stridor.   Cardiovascular: Normal rate, regular rhythm. Grossly normal heart sounds.  Good peripheral circulation. Respiratory: Normal respiratory effort.  No retractions. Lungs CTAB. Gastrointestinal: Soft and nontenderThroughout the lower abdomen but focal tenderness over the epigastrium with a questionably positive Murphy sign, though the patient reports fairly intense pain throughout the epigastrium right upper quadrant and some in the left as well. No lower abdominal pain. No right lower quadrant tenderness or pain at McBurney's point. No distention. No abdominal bruits. No CVA tenderness. Musculoskeletal: No lower extremity tenderness nor edema.  No joint effusions. Neurologic:  Normal speech and language. No gross focal neurologic deficits are appreciated.  Skin:  Skin is warm, dry and intact. No rash noted. Psychiatric: Mood and affect are normal. Speech and behavior are normal.  ____________________________________________   LABS (all labs ordered are listed, but only abnormal results are displayed)  Labs Reviewed  CBC - Abnormal; Notable for the following:       Result Value   WBC 11.4 (*)    All other components within normal limits  COMPREHENSIVE METABOLIC PANEL - Abnormal; Notable for the following:    Chloride 100 (*)    Total Bilirubin 0.2 (*)    All other components within normal  limits  LIPASE, BLOOD - Abnormal; Notable for the following:    Lipase 346 (*)    All other components within normal limits  URINALYSIS COMPLETEWITH MICROSCOPIC ([facility] ONLY) - Abnormal; Notable for the following:    Color, Urine YELLOW (*)    APPearance CLEAR (*)    Hgb urine dipstick 3+ (*)    Protein, ur 30 (*)    Leukocytes, UA 3+ (*)    Squamous Epithelial / LPF 0-5 (*)    All other components within normal limits  URINE CULTURE  HCG, QUANTITATIVE, PREGNANCY  SEDIMENTATION RATE   ____________________________________________  EKG   ____________________________________________  RADIOLOGY  US Abdomen Limited Ruq  Result Date: 05/21/2016 CLINICAL DATA:  Abdominal pain beginning today. History of pancreatitis. EXAM: US ABDOMEN LIMITED - RIGHT UPPER QUADRANT COMPARISON:  CT abdomen and pelvis 03/28/2016.  MRCP 05/07/2016. FINDINGS: Gallbladder: No gallstones or wall thickening visualized. No sonographic Murphy sign noted by sonographer. Common bile duct: Diameter: 0.8 cm Liver: No focal  lesion identified. Within normal limits in parenchymal echogenicity. IMPRESSION: Negative for gallstones.  Negative exam. Electronically Signed   By: Inge Rise M.D.   On: 05/21/2016 21:36    ____________________________________________   PROCEDURES  Procedure(s) performed: None  Procedures  Critical Care performed: No  ____________________________________________   INITIAL IMPRESSION / ASSESSMENT AND PLAN / ED COURSE  Pertinent labs & imaging results that were available during my care of the patient were reviewed by me and considered in my medical decision making (see chart for details).  History of recurrent idiopathic pancreatitis and ulcerative pruritus re-presents today with what she describes recurrent pancreatitis like pain. She had an extensive recent GI workup, and etiology is still unclear. Today she appears clinically stable, no evidence of dehydration, and pain control  seems to be primary concern. No evidence of sepsis, fever, or acute infection though she does have pyuria on urinalysis, no bacteria are seen in previous notation is noted of sterile pyuria. She denies any urinary symptoms or fevers, and I discussed with them and we will culture this.  ----------------------------------------- 11:39 PM on 05/21/2016 -----------------------------------------  Discussed with the patient admission for pain control, IV fluids and hydration versus outpatient care and close follow-up. After discussing the benefits and relative risks of each, the patient elected that she would like to go to go home as her pain is well-controlled now, she is able to drink 2 glasses of orange juice, and she has previously prescribed nausea medicine and oxycodone at home. She will be planning to call and follow up closely with Dr. Allen Norris tomorrow morning.  Very careful return precautions discussed the patient and her mother were both in agreement. Patient appears clinically stable for outpatient management at this time, pain controlled, no vomiting, no evidence of acute instability.  Return precautions and treatment recommendations and follow-up discussed with the patient who is agreeable with the plan.  Mother will be driving her home  Clinical Course     ____________________________________________   FINAL CLINICAL IMPRESSION(S) / ED DIAGNOSES  Final diagnoses:  Abdominal pain  Idiopathic acute pancreatitis      NEW MEDICATIONS STARTED DURING THIS VISIT:  New Prescriptions   No medications on file     Note:  This document was prepared using Dragon voice recognition software and may include unintentional dictation errors.     Delman Kitten, MD 05/21/16 [phone]

## 2016-05-22 ENCOUNTER — Telehealth: Payer: Self-pay | Admitting: Gastroenterology

## 2016-05-22 ENCOUNTER — Other Ambulatory Visit: Payer: Self-pay | Admitting: Family Medicine

## 2016-05-22 ENCOUNTER — Ambulatory Visit: Payer: BC Managed Care – PPO | Admitting: Family Medicine

## 2016-05-22 NOTE — Telephone Encounter (Signed)
Patient wanted to let you and Dr. Allen Norris know that she had to go to the ER last night for pancreatitis.

## 2016-05-23 LAB — URINE CULTURE: Culture: <10000 — AB

## 2016-05-23 NOTE — Telephone Encounter (Signed)
Rx called in to pharmacy. 

## 2016-05-25 ENCOUNTER — Telehealth: Payer: Self-pay | Admitting: Family Medicine

## 2016-05-25 NOTE — Telephone Encounter (Signed)
Pt stated that due to her on going issues and hospital visits she has been advised to see a GI & Pancreatic Specialist. Pt would like to get a referral to see Dr. Dorothyann Peng Branch(Pancreatic) Dr. Raynelle Dick) both are with [facility]. Pt was advised she may need an OV for referral. Please advise. Thanks TNP

## 2016-05-25 NOTE — Telephone Encounter (Signed)
Please refer to Dr. Duffy RhodyStanley Branch(Pancreatic) Dr. Mathews ArgyleJane Onken(GI[facility] pancreat[facility] and Crohn's disease

## 2016-05-28 ENCOUNTER — Other Ambulatory Visit: Payer: Self-pay

## 2016-05-28 NOTE — Telephone Encounter (Signed)
Patient has called to check the status of her Humara prescription. I have advised patient that Dr Dorothey Baseman nurse will contact her with this information.

## 2016-05-28 NOTE — Telephone Encounter (Signed)
A referral has been faxed to [facility] for recurrent pancreatitis to [phone] including all clinic notes, labs and demographics. A scheduler will call the patient with an appointment date and time once the chart has been reviewed by a nurse.   I will check the status of this referral.

## 2016-05-28 NOTE — Telephone Encounter (Signed)
Thank you :)

## 2016-05-28 NOTE — Telephone Encounter (Signed)
Dr Dorothey Baseman office has started referral process to [facility].Pt advised

## 2016-05-28 NOTE — Telephone Encounter (Signed)
Pt notified Humira was approved through her insurance but was transferred to [facility]. A new rx was faxed to them.

## 2016-06-06 ENCOUNTER — Telehealth: Payer: Self-pay

## 2016-06-06 NOTE — Telephone Encounter (Signed)
Pt called today requesting the status of her Humira medication and [facility] referral.   I contacted [facility] and the representative stated they had tried to contact pt but had not received a return call. They needed to get her set up as a new pt. She gave me the number for her to call them to set up. [phone].   I contacted [facility] and front desk stated they had the referral but it was still in process Pt will be contacted once its reviewed.   I contacted pt back with all this information.

## 2016-06-07 ENCOUNTER — Ambulatory Visit: Payer: Self-pay | Admitting: Gastroenterology

## 2016-06-18 ENCOUNTER — Telehealth: Payer: Self-pay

## 2016-06-18 NOTE — Telephone Encounter (Signed)
Jo Perkins from [facility] called asking for additional information. She needs the CT results from her scan on 04/05/2016. She is also needing hospital clinicals to include all of the labs that she had done and all of her past notes leading back to where she was first diagnosed with pancreatis. Her contact number is [phone] and her fax number is [phone].

## 2016-06-19 ENCOUNTER — Telehealth: Payer: Self-pay | Admitting: Gastroenterology

## 2016-06-19 NOTE — Telephone Encounter (Signed)
Patient call and was checking on the referral that was going to be sent out. She wanted to let you know she has been waiting for almost a month now and is getting frustrated.

## 2016-06-19 NOTE — Telephone Encounter (Signed)
[facility] at information provided.

## 2016-06-19 NOTE — Telephone Encounter (Signed)
Advised pt I have faxed all additional [facility] to [facility] per [facility]. Told her it does sometimes take a while before getting an appt. Her chart is still in review waiting for the physician to complete review.

## 2016-07-02 ENCOUNTER — Telehealth: Payer: Self-pay | Admitting: Family Medicine

## 2016-07-02 DIAGNOSIS — K51919 Ulcerative colitis, unspecified with unspecified complications: Secondary | ICD-10-CM

## 2016-07-02 NOTE — Telephone Encounter (Signed)
Pt needs referral for GI. For ulcer colitis  She wants to see Dr. Jeanne Ivan at [facility]   Pt's call back is [phone]  She has seen Dr. Caryn Section before.  Thanks Con Memos

## 2016-07-02 NOTE — Telephone Encounter (Signed)
Please advise 

## 2016-07-02 NOTE — Telephone Encounter (Signed)
Please refer GI as requested below.

## 2016-07-03 ENCOUNTER — Emergency Department
Admission: EM | Admit: 2016-07-03 | Discharge: 2016-07-03 | Disposition: A | Discharge status: Home / Self Care | Payer: BC Managed Care – PPO | Attending: Emergency Medicine | Admitting: Emergency Medicine

## 2016-07-03 ENCOUNTER — Encounter: Payer: Self-pay | Admitting: Emergency Medicine

## 2016-07-03 DIAGNOSIS — R1013 Epigastric pain: Secondary | ICD-10-CM | POA: Insufficient documentation

## 2016-07-03 LAB — URINALYSIS COMPLETE WITH MICROSCOPIC (ARMC ONLY)
BILIRUBIN URINE: NEGATIVE
GLUCOSE, UA: NEGATIVE mg/dL
HGB URINE DIPSTICK: NEGATIVE
Ketones, ur: NEGATIVE mg/dL
NITRITE: NEGATIVE
Protein, ur: NEGATIVE mg/dL
SPECIFIC GRAVITY, URINE: 1.004 — AB (ref 1.005–1.030)
pH: 7 (ref 5.0–8.0)

## 2016-07-03 LAB — LIPASE, BLOOD: Lipase: 46 U/L (ref 11–51)

## 2016-07-03 LAB — COMPREHENSIVE METABOLIC PANEL
ALK PHOS: 47 U/L (ref 38–126)
ALT: 13 U/L — AB (ref 14–54)
AST: 18 U/L (ref 15–41)
Albumin: 4.5 g/dL (ref 3.5–5.0)
Anion gap: 7 (ref 5–15)
BILIRUBIN TOTAL: 0.6 mg/dL (ref 0.3–1.2)
BUN: 13 mg/dL (ref 6–20)
CALCIUM: 9.6 mg/dL (ref 8.9–10.3)
CO2: 26 mmol/L (ref 22–32)
CREATININE: 0.66 mg/dL (ref 0.44–1.00)
Chloride: 104 mmol/L (ref 101–111)
GFR calc Af Amer: >60 mL/min (ref >60)
GFR calc non Af Amer: >60 mL/min (ref >60)
Glucose, Bld: 88 mg/dL (ref 65–99)
POTASSIUM: 4.4 mmol/L (ref 3.5–5.1)
Sodium: 137 mmol/L (ref 135–145)
TOTAL PROTEIN: 8.4 g/dL — AB (ref 6.5–8.1)

## 2016-07-03 LAB — CBC
HCT: 39.2 % (ref 35.0–47.0)
Hemoglobin: 13.1 g/dL (ref 12.0–16.0)
MCH: 28.3 pg (ref 26.0–34.0)
MCHC: 33.5 g/dL (ref 32.0–36.0)
MCV: 84.5 fL (ref 80.0–100.0)
PLATELETS: 290 10*3/uL (ref 150–440)
RBC: 4.64 MIL/uL (ref 3.80–5.20)
RDW: 13.4 % (ref 11.5–14.5)
WBC: 8.3 10*3/uL (ref 3.6–11.0)

## 2016-07-03 LAB — TROPONIN I: Troponin I: <0.03 ng/mL (ref <0.03)

## 2016-07-03 LAB — POCT PREGNANCY, URINE: PREG TEST UR: NEGATIVE

## 2016-07-03 MED ORDER — GI COCKTAIL ~~LOC~~
30.0000 mL | Freq: Once | ORAL | Status: AC
  Administered 2016-07-03: 30 mL via ORAL
  Filled 2016-07-03: qty 30

## 2016-07-03 MED ORDER — RANITIDINE HCL 150 MG PO TABS: 150.0000 mg | ORAL_TABLET | Freq: Two times a day (BID) | ORAL | 1 refills | Status: DC

## 2016-07-03 MED ORDER — SUCRALFATE 1 G PO TABS: 1.0000 g | ORAL_TABLET | Freq: Four times a day (QID) | ORAL | 0 refills | Status: DC

## 2016-07-03 NOTE — ED Provider Notes (Signed)
[facility] Emergency Department Provider Note    ____________________________________________   I have reviewed the triage vital signs and the nursing notes.   HISTORY  Chief Complaint Abdominal Pain   History limited by: Not Limited   HPI Jo Perkins is a 30 y.o. female with history of ulcerative colitis and pancreatitis who presents to the emergency department today because of concern for epigastric pain. The pain radiates to her back. It is sharp. It is severe. Gradually got worse throughout the day. States it feels similar to her previous episode of pancreatitis. Has not had any nausea, vomiting or diarrhea with it. Denies any fevers.    Past Medical History:  Diagnosis Date  . Depression   . History of colon polyps   . Panic attack   . Ulcerative colitis [facility](HCC)     Patient Active Problem List   Diagnosis Date Noted  . Exacerbation of ulcerative colitis (HCC) 05/08/2016  . Acute posthemorrhagic anemia 05/08/2016  . Gallbladder sludge 05/08/2016  . RUQ pain 05/08/2016  . Acute pancreatitis 05/05/2016  . Sterile pyuria 05/05/2016  . Recurrent pancreatitis (HCC) 05/05/2016  . Renal lesion 04/23/2016  . Pancreatitis 03/30/2016  . Pancreatitis, acute   . Elevated lipase   . History of colon polyps 03/16/2015  . Panic 03/16/2015  . 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 none      Prior to Admission medications   Medication Sig Start Date End Date Taking? Authorizing Provider  clonazePAM (KLONOPIN) 0.5 MG tablet TAKE 1 TABLET BY MOUTH IN THE MORNING AND 1/2 TABLET ONCE TO TWICE DAILY AS NEEDED 05/23/16   Malva Limesonald E Fisher, MD  HUMIRA PEN-CROHNS STARTER 40 MG/0.8ML PNKT  05/21/16   Historical Provider, MD  oxyCODONE (OXY IR/ROXICODONE) 5 MG immediate release tablet Take 1 tablet (5 mg total) by mouth every 4 (four) hours as needed for moderate pain. Patient not taking: Reported on 05/14/2016  05/08/16   Katharina Caperima Vaickute, MD  predniSONE (STERAPRED UNI-PAK 21 TAB) 10 MG (21) TBPK tablet Take 2 pills daily (20mg ) in the morning for 2 weeks, then taper by 1 pill daily (10 mg) for  2 weeks. 05/17/16   [name] Wohl, MD  sertraline (ZOLOFT) 100 MG tablet TAKE 1 TABLET BY MOUTH EVERY DAY Patient taking differently: TAKE 1 TABLET BY MOUTH EVERY MORNING. 03/05/16   Malva Limesonald E Fisher, MD    Allergies Imuran  [azathioprine]  Family History  Problem Relation Age of Onset  . Diabetes Maternal Grandmother     type 2  . Stroke Maternal Grandmother   . Heart attack Maternal Grandmother   . Lung cancer Maternal Grandfather     Social History Social History  Substance Use Topics  . Smoking status: Never Smoker  . Smokeless tobacco: Never Used  . Alcohol use 0.0 oz/week     Comment: rare    Review of Systems  Constitutional: Negative for fever. Cardiovascular: Negative for chest pain. Respiratory: Negative for shortness of breath. Gastrointestinal: Positive for abdominal pain. Genitourinary: Negative for dysuria. Musculoskeletal: Negative for back pain. Skin: Negative for rash. Neurological: Negative for headaches, focal weakness or numbness.  10-point ROS otherwise negative.  ____________________________________________   PHYSICAL EXAM:  VITAL SIGNS: ED Triage Vitals  Enc Vitals Group     BP 07/03/16 2003 122/72     Pulse Rate 07/03/16 2003 65     Resp 07/03/16 2003 15     Temp 07/03/16 2003  98.6 F (37 C)     Temp Source 07/03/16 2003 Oral     SpO2 07/03/16 2003 100 %     Weight 07/03/16 2004 174 lb (78.9 kg)     Height 07/03/16 2004 5\' 9"  (1.753 m)     Head Circumference --      Peak Flow --      Pain Score 07/03/16 2004 9   Constitutional: Alert and oriented. Well appearing and in no distress. Eyes: Conjunctivae are normal. Normal extraocular movements. ENT   Head: Normocephalic and atraumatic.   Nose: No congestion/rhinnorhea.   Mouth/Throat: Mucous  membranes are moist.   Neck: No stridor. Hematological/Lymphatic/Immunilogical: No cervical lymphadenopathy. Cardiovascular: Normal rate, regular rhythm.  No murmurs, rubs, or gallops.  Respiratory: Normal respiratory effort without tachypnea nor retractions. Breath sounds are clear and equal bilaterally. No wheezes/rales/rhonchi. Gastrointestinal: Soft and minimally tender over the epigastric region. Genitourinary: Deferred Musculoskeletal: Normal range of motion in all extremities. No lower extremity edema. Neurologic:  Normal speech and language. No gross focal neurologic deficits are appreciated.  Skin:  Skin is warm, dry and intact. No rash noted. Psychiatric: Mood and affect are normal. Speech and behavior are normal. Patient exhibits appropriate insight and judgment.  ____________________________________________    LABS (pertinent positives/negatives)  Labs Reviewed  COMPREHENSIVE METABOLIC PANEL - Abnormal; Notable for the following:       Result Value   Total Protein 8.4 (*)    ALT 13 (*)    All other components within normal limits  URINALYSIS COMPLETEWITH MICROSCOPIC ([facility] ONLY) - Abnormal; Notable for the following:    Color, Urine COLORLESS (*)    APPearance CLEAR (*)    Specific Gravity, Urine 1.004 (*)    Leukocytes, UA 1+ (*)    Bacteria, UA RARE (*)    Squamous Epithelial / LPF 0-5 (*)    All other components within normal limits  LIPASE, BLOOD  CBC  TROPONIN I  POC URINE PREG, ED  POCT PREGNANCY, URINE     ____________________________________________   EKG  None  ____________________________________________    RADIOLOGY  None   ____________________________________________   PROCEDURES  Procedures  ____________________________________________   INITIAL IMPRESSION / ASSESSMENT AND PLAN / ED COURSE  Pertinent labs & imaging results that were available during my care of the patient were reviewed by me and considered in my medical  decision making (see chart for details).  Patient stated she felt some improvement after GI cocktail. Patient's blood work without any concerning findings for pancreatitis. Will plan on discharging home with antacid and sucralfate.  ____________________________________________   FINAL CLINICAL IMPRESSION(S) / ED DIAGNOSES  Final diagnoses:  Epigastric pain     Note: This dictation was prepared with Dragon dictation. Any transcriptional errors that result from this process are unintentional    Phineas Semen, MD 07/03/16 2245

## 2016-07-03 NOTE — ED Triage Notes (Signed)
Pt ambulatory to triage with steady gait, no distress noted. Pt c/o upper abdominal pain that radiates into her back. Pt reports she has been seen in the ED x5 in the last 4 months, twice being admitted for pancreatitis. Pt denies N/V/D.

## 2016-07-03 NOTE — Discharge Instructions (Signed)
Please seek medical attention for any high fevers, chest pain, shortness of breath, change in behavior, persistent vomiting, bloody stool or any other new or concerning symptoms.  

## 2016-07-17 NOTE — Telephone Encounter (Signed)
Pt has appt Dec 15 with [facility].

## 2016-09-19 ENCOUNTER — Encounter: Payer: Self-pay | Admitting: *Deleted

## 2016-09-19 ENCOUNTER — Emergency Department
Admission: EM | Admit: 2016-09-19 | Discharge: 2016-09-19 | Disposition: A | Discharge status: Home / Self Care | Payer: BC Managed Care – PPO | Attending: Student in an Organized Health Care Education/Training Program | Admitting: Student in an Organized Health Care Education/Training Program

## 2016-09-19 ENCOUNTER — Emergency Department: Payer: BC Managed Care – PPO

## 2016-09-19 DIAGNOSIS — R1013 Epigastric pain: Secondary | ICD-10-CM | POA: Diagnosis not present

## 2016-09-19 LAB — URINALYSIS, COMPLETE (UACMP) WITH MICROSCOPIC
Bilirubin Urine: NEGATIVE
Glucose, UA: NEGATIVE mg/dL
Ketones, ur: NEGATIVE mg/dL
Nitrite: NEGATIVE
Protein, ur: NEGATIVE mg/dL
SPECIFIC GRAVITY, URINE: 1.003 — AB (ref 1.005–1.030)
pH: 7 (ref 5.0–8.0)

## 2016-09-19 LAB — COMPREHENSIVE METABOLIC PANEL
ALBUMIN: 4.2 g/dL (ref 3.5–5.0)
ALK PHOS: 58 U/L (ref 38–126)
ALT: 18 U/L (ref 14–54)
AST: 23 U/L (ref 15–41)
Anion gap: 7 (ref 5–15)
BUN: 12 mg/dL (ref 6–20)
CALCIUM: 9.5 mg/dL (ref 8.9–10.3)
CO2: 27 mmol/L (ref 22–32)
CREATININE: 0.65 mg/dL (ref 0.44–1.00)
Chloride: 100 mmol/L — ABNORMAL LOW (ref 101–111)
GFR calc Af Amer: >60 mL/min (ref >60)
GFR calc non Af Amer: >60 mL/min (ref >60)
GLUCOSE: 82 mg/dL (ref 65–99)
Potassium: 3.4 mmol/L — ABNORMAL LOW (ref 3.5–5.1)
SODIUM: 134 mmol/L — AB (ref 135–145)
Total Bilirubin: 0.4 mg/dL (ref 0.3–1.2)
Total Protein: 8.1 g/dL (ref 6.5–8.1)

## 2016-09-19 LAB — CBC
HCT: 37 % (ref 35.0–47.0)
Hemoglobin: 12.3 g/dL (ref 12.0–16.0)
MCH: 26.6 pg (ref 26.0–34.0)
MCHC: 33.1 g/dL (ref 32.0–36.0)
MCV: 80.5 fL (ref 80.0–100.0)
PLATELETS: 322 10*3/uL (ref 150–440)
RBC: 4.6 MIL/uL (ref 3.80–5.20)
RDW: 13.7 % (ref 11.5–14.5)
WBC: 7.8 10*3/uL (ref 3.6–11.0)

## 2016-09-19 LAB — POCT PREGNANCY, URINE: Preg Test, Ur: NEGATIVE

## 2016-09-19 LAB — LIPASE, BLOOD: Lipase: 22 U/L (ref 11–51)

## 2016-09-19 MED ORDER — PROMETHAZINE HCL 25 MG/ML IJ SOLN
12.5000 mg | Freq: Once | INTRAMUSCULAR | Status: AC
  Administered 2016-09-19: 12.5 mg via INTRAVENOUS
  Filled 2016-09-19 (×2): qty 1

## 2016-09-19 MED ORDER — RANITIDINE HCL 150 MG PO TABS: 150.0000 mg | ORAL_TABLET | Freq: Two times a day (BID) | ORAL | 0 refills | Status: DC

## 2016-09-19 MED ORDER — IOPAMIDOL (ISOVUE-300) INJECTION 61%
100.0000 mL | Freq: Once | INTRAVENOUS | Status: AC | PRN
  Administered 2016-09-19: 100 mL via INTRAVENOUS
  Filled 2016-09-19: qty 100

## 2016-09-19 MED ORDER — MORPHINE SULFATE (PF) 4 MG/ML IV SOLN: 4.0000 mg | INTRAVENOUS | Status: DC | PRN

## 2016-09-19 MED ORDER — SODIUM CHLORIDE 0.9 % IV BOLUS (SEPSIS)
1000.0000 mL | Freq: Once | INTRAVENOUS | Status: AC
  Administered 2016-09-19: 1000 mL via INTRAVENOUS

## 2016-09-19 MED ORDER — PROMETHAZINE HCL 12.5 MG PO TABS: 12.5000 mg | ORAL_TABLET | Freq: Four times a day (QID) | ORAL | 0 refills | Status: DC | PRN

## 2016-09-19 MED ORDER — GI COCKTAIL ~~LOC~~
30.0000 mL | Freq: Once | ORAL | Status: AC
  Administered 2016-09-19: 30 mL via ORAL
  Filled 2016-09-19: qty 30

## 2016-09-19 NOTE — ED Provider Notes (Signed)
[facility] Emergency Department Provider Note    First MD Initiated Contact with Patient 09/19/16 1541     (approximate)  I have reviewed the triage vital signs and the nursing notes.   HISTORY  Chief Complaint Abdominal Pain    HPI Jo Perkins is a 31 y.o. female with a history of ulcerative colitis presents with acute epigastric pain associated with nausea vomiting and bloody diarrhea. Patient is on Humira for her ulcerative colitis. She is followed by [facility]. Denies any recent fevers. States the pain is consistent with previous episodes of pancreatitis. Denies any shortness of breath or chest pain. Does feel dehydrated. Eyes any dysuria, urinary frequency or flank pain.   Past Medical History:  Diagnosis Date  . Depression   . History of colon polyps   . Panic attack   . Ulcerative colitis (HCC)    Family History  Problem Relation Age of Onset  . Diabetes Maternal Grandmother     type 2  . Stroke Maternal Grandmother   . Heart attack Maternal Grandmother   . Lung cancer Maternal Grandfather    Past Surgical History:  Procedure Laterality Date  . none     Patient Active Problem List   Diagnosis Date Noted  . Exacerbation of ulcerative colitis (HCC) 05/08/2016  . Acute posthemorrhagic anemia 05/08/2016  . Gallbladder sludge 05/08/2016  . RUQ pain 05/08/2016  . Acute pancreatitis 05/05/2016  . Sterile pyuria 05/05/2016  . Recurrent pancreatitis (HCC) 05/05/2016  . Renal lesion 04/23/2016  . Pancreatitis 03/30/2016  . Pancreatitis, acute   . Elevated lipase   . History of colon polyps 03/16/2015  . Panic 03/16/2015  . Colitis gravis (HCC) 03/16/2015      Prior to Admission medications   Medication Sig Start Date End Date Taking? Authorizing Provider  clonazePAM (KLONOPIN) 0.5 MG tablet TAKE 1 TABLET BY MOUTH IN THE MORNING AND 1/2 TABLET ONCE TO TWICE DAILY AS NEEDED 05/23/16   Malva Limes, MD  HUMIRA PEN-CROHNS STARTER 40  MG/0.8ML PNKT  05/21/16   Historical Provider, MD  oxyCODONE (OXY IR/ROXICODONE) 5 MG immediate release tablet Take 1 tablet (5 mg total) by mouth every 4 (four) hours as needed for moderate pain. Patient not taking: Reported on 05/14/2016 05/08/16   Katharina Caper, MD  predniSONE (STERAPRED UNI-PAK 21 TAB) 10 MG (21) TBPK tablet Take 2 pills daily (20mg ) in the morning for 2 weeks, then taper by 1 pill daily (10 mg) for  2 weeks. 05/17/16   Midge Minium, MD  promethazine (PHENERGAN) 12.5 MG tablet Take 1 tablet (12.5 mg total) by mouth every 6 (six) hours as needed for nausea or vomiting. 09/19/16   Willy Eddy, MD  ranitidine (ZANTAC) 150 MG tablet Take 1 tablet (150 mg total) by mouth 2 (two) times daily. 09/19/16 09/19/17  Willy Eddy, MD  sertraline (ZOLOFT) 100 MG tablet TAKE 1 TABLET BY MOUTH EVERY DAY Patient taking differently: TAKE 1 TABLET BY MOUTH EVERY MORNING. 03/05/16   Malva Limes, MD  sucralfate (CARAFATE) 1 g tablet Take 1 tablet (1 g total) by mouth 4 (four) times daily. 07/03/16   Phineas Semen, MD    Allergies Imuran  [azathioprine]    Social History Social History  Substance Use Topics  . Smoking status: Never Smoker  . Smokeless tobacco: Never Used  . Alcohol use 0.0 oz/week     Comment: rare    Review of Systems Patient denies headaches, rhinorrhea, blurry vision, numbness,  shortness of breath, chest pain, edema, cough, abdominal pain, nausea, vomiting, diarrhea, dysuria, fevers, rashes or hallucinations unless otherwise stated above in HPI. ____________________________________________   PHYSICAL EXAM:  VITAL SIGNS: Vitals:   09/19/16 1355 09/19/16 1833  BP: 118/77 108/65  Pulse: 67 (!) 57  Resp: 18 18  Temp: 98.2 F (36.8 C)     Constitutional: Alert and oriented. Well appearing and in no acute distress. Eyes: Conjunctivae are normal. PERRL. EOMI. Head: Atraumatic. Nose: No congestion/rhinnorhea. Mouth/Throat: Mucous membranes are moist.   Oropharynx non-erythematous. Neck: No stridor. Painless ROM. No cervical spine tenderness to palpation Hematological/Lymphatic/Immunilogical: No cervical lymphadenopathy. Cardiovascular: Normal rate, regular rhythm. Grossly normal heart sounds.  Good peripheral circulation. Respiratory: Normal respiratory effort.  No retractions. Lungs CTAB. Gastrointestinal: mild ttp of epigastrium. No distention. No abdominal bruits. No CVA tenderness. Genitourinary:  Musculoskeletal: No lower extremity tenderness nor edema.  No joint effusions. Neurologic:  Normal speech and language. No gross focal neurologic deficits are appreciated. No gait instability. Skin:  Skin is warm, dry and intact. No rash noted. Psychiatric: Mood and affect are normal. Speech and behavior are normal.  ____________________________________________   LABS (all labs ordered are listed, but only abnormal results are displayed)  Results for orders placed or performed during the hospital encounter of 09/19/16 (from the past 24 hour(s))  Pregnancy, urine POC     Status: None   Collection Time: 09/19/16  2:01 PM  Result Value Ref Range   Preg Test, Ur NEGATIVE NEGATIVE  Lipase, blood     Status: None   Collection Time: 09/19/16  2:04 PM  Result Value Ref Range   Lipase 22 11 - 51 U/L  Comprehensive metabolic panel     Status: Abnormal   Collection Time: 09/19/16  2:04 PM  Result Value Ref Range   Sodium 134 (L) 135 - 145 mmol/L   Potassium 3.4 (L) 3.5 - 5.1 mmol/L   Chloride 100 (L) 101 - 111 mmol/L   CO2 27 22 - 32 mmol/L   Glucose, Bld 82 65 - 99 mg/dL   BUN 12 6 - 20 mg/dL   Creatinine, Ser 1.61 0.44 - 1.00 mg/dL   Calcium 9.5 8.9 - 09.6 mg/dL   Total Protein 8.1 6.5 - 8.1 g/dL   Albumin 4.2 3.5 - 5.0 g/dL   AST 23 15 - 41 U/L   ALT 18 14 - 54 U/L   Alkaline Phosphatase 58 38 - 126 U/L   Total Bilirubin 0.4 0.3 - 1.2 mg/dL   GFR calc non Af Amer >60 >60 mL/min   GFR calc Af Amer >60 >60 mL/min   Anion gap 7 5 -  15  CBC     Status: None   Collection Time: 09/19/16  2:04 PM  Result Value Ref Range   WBC 7.8 3.6 - 11.0 K/uL   RBC 4.60 3.80 - 5.20 MIL/uL   Hemoglobin 12.3 12.0 - 16.0 g/dL   HCT 04.5 40.9 - 81.1 %   MCV 80.5 80.0 - 100.0 fL   MCH 26.6 26.0 - 34.0 pg   MCHC 33.1 32.0 - 36.0 g/dL   RDW 91.4 78.2 - 95.6 %   Platelets 322 150 - 440 K/uL  Urinalysis, Complete w Microscopic     Status: Abnormal   Collection Time: 09/19/16  2:04 PM  Result Value Ref Range   Color, Urine STRAW (A) YELLOW   APPearance CLEAR (A) CLEAR   Specific Gravity, Urine 1.003 (L) 1.005 - 1.030  pH 7.0 5.0 - 8.0   Glucose, UA NEGATIVE NEGATIVE mg/dL   Hgb urine dipstick SMALL (A) NEGATIVE   Bilirubin Urine NEGATIVE NEGATIVE   Ketones, ur NEGATIVE NEGATIVE mg/dL   Protein, ur NEGATIVE NEGATIVE mg/dL   Nitrite NEGATIVE NEGATIVE   Leukocytes, UA LARGE (A) NEGATIVE   RBC / HPF 0-5 0 - 5 RBC/hpf   WBC, UA 6-30 0 - 5 WBC/hpf   Bacteria, UA MANY (A) NONE SEEN   Squamous Epithelial / LPF 0-5 (A) NONE SEEN   ____________________________________________  EKG ____________________________________________  RADIOLOGY  I personally reviewed all radiographic images ordered to evaluate for the above acute complaints and reviewed radiology reports and findings.  These findings were personally discussed with the patient.  Please see medical record for radiology report.  ____________________________________________   PROCEDURES  Procedure(s) performed:  Procedures    Critical Care performed: no ____________________________________________   INITIAL IMPRESSION / ASSESSMENT AND PLAN / ED COURSE  Pertinent labs & imaging results that were available during my care of the patient were reviewed by me and considered in my medical decision making (see chart for details).  DDX: UC flare, cholelithiasis, cholecystitis, dehydration, gastritis   Jobie QuakerMary K Hiott is a 31 y.o. who presents to the ED with epigastric pain as  described above. Patient afebrile hemodynamic stable. Patient with complex presentation given history of ulcerative colitis.  Blood work is otherwise reassuring and there is no evidence of acute pancreatitis or biliary pathology. Patient is afebrile without leukocytosis therefore doubt acute infectious process. Based on her history of ulcerative colitis and concern for consultation with fistula versus perforation given her epigastric pain will order CT imaging to evaluate for surgical abnormality.  The patient will be placed on continuous pulse oximetry and telemetry for monitoring.  Laboratory evaluation will be sent to evaluate for the above complaints.      Clinical Course as of Sep 19 1910  Wed Sep 19, 2016  1821 CT imaging is reassuring. No evidence of acute pathology. Blood work is otherwise reassuring as well. Urinalysis does show possible uterine tract infection the patient denies any symptoms. She is otherwise asymptomatic with a few squamous epithelial cells Andrey CampanileWilson that this is contaminant have encouraged aggressive oral hydration and follow up with PCP.  Patient was able to tolerate PO and was able to ambulate with a steady gait.  Have discussed with the patient and available family all diagnostics and treatments performed thus far and all questions were answered to the best of my ability. The patient demonstrates understanding and agreement with plan.   [PR]    Clinical Course User Index [PR] Willy EddyPatrick Chryl Holten, MD     ____________________________________________   FINAL CLINICAL IMPRESSION(S) / ED DIAGNOSES  Final diagnoses:  Epigastric pain      NEW MEDICATIONS STARTED DURING THIS VISIT:  Discharge Medication List as of 09/19/2016  6:24 PM    START taking these medications   Details  promethazine (PHENERGAN) 12.5 MG tablet Take 1 tablet (12.5 mg total) by mouth every 6 (six) hours as needed for nausea or vomiting., Starting Wed 09/19/2016, Print         Note:  This  document was prepared using Dragon voice recognition software and may include unintentional dictation errors.    Willy EddyPatrick Adaliz Dobis, MD 09/19/16 [id number]

## 2016-09-19 NOTE — ED Triage Notes (Signed)
Pt complains of upper abdominal pain, pt reports having pancreatitis three times last year, pt complains of nausea today no vomiting

## 2016-11-27 ENCOUNTER — Other Ambulatory Visit: Payer: Self-pay | Admitting: Family Medicine

## 2016-11-27 NOTE — Telephone Encounter (Signed)
Please call in clonazepam  

## 2016-11-28 NOTE — Telephone Encounter (Signed)
RX called in at [facility] pharmacy  

## 2017-04-01 ENCOUNTER — Other Ambulatory Visit: Payer: Self-pay | Admitting: Family Medicine

## 2017-05-20 ENCOUNTER — Other Ambulatory Visit: Payer: Self-pay | Admitting: Family Medicine

## 2017-05-20 NOTE — Telephone Encounter (Signed)
Please call in clonazepam  

## 2017-05-21 NOTE — Telephone Encounter (Signed)
Rx called in to pharmacy. 

## 2017-08-11 ENCOUNTER — Other Ambulatory Visit: Payer: Self-pay | Admitting: Family Medicine

## 2017-08-12 NOTE — Telephone Encounter (Signed)
OK to call in clonazepam, but patient has not been seen in over a year and needs to schedule follow up office visit within the nest month.

## 2017-08-13 NOTE — Telephone Encounter (Signed)
Rx called into pharmacy. Patient scheduled appt for 08/20/2017 at 4:15 pm.

## 2017-08-20 ENCOUNTER — Ambulatory Visit: Payer: Self-pay | Admitting: Family Medicine

## 2017-08-21 ENCOUNTER — Ambulatory Visit: Payer: Self-pay | Admitting: Family Medicine

## 2017-08-21 NOTE — Progress Notes (Deleted)
       Patient: Jo LeschMary K Perkins Female    DOB: Jul 23, 1986   30 y.o.   MRN: [id number] Visit Date: 08/21/2017  Today's Provider: Mila Merryonald Fisher, MD   No chief complaint on file.  Subjective:    HPI  Panic From 02/08/2016-advised patient to try cutting dose to 1/2 tablet a day. Recommended stopping all together. Refilled clonazepam. Follow up in 1 year if doing well.    Allergies  Allergen Reactions  . Imuran  [Azathioprine]     caused pancreatitis     Current Outpatient Medications:  .  clonazePAM (KLONOPIN) 0.5 MG tablet, TAKE 1 TABLET BY MOUTH IN THE MORNING AND 1/2 TAB ONCE TO TWICE A DAY AS NEEDED, Disp: 30 tablet, Rfl: 1 .  HUMIRA PEN-CROHNS STARTER 40 MG/0.8ML PNKT, , Disp: , Rfl:  .  oxyCODONE (OXY IR/ROXICODONE) 5 MG immediate release tablet, Take 1 tablet (5 mg total) by mouth every 4 (four) hours as needed for moderate pain. (Patient not taking: Reported on 05/14/2016), Disp: 30 tablet, Rfl: 0 .  predniSONE (STERAPRED UNI-PAK 21 TAB) 10 MG (21) TBPK tablet, Take 2 pills daily (20mg ) in the morning for 2 weeks, then taper by 1 pill daily (10 mg) for  2 weeks., Disp: 42 tablet, Rfl: 0 .  promethazine (PHENERGAN) 12.5 MG tablet, Take 1 tablet (12.5 mg total) by mouth every 6 (six) hours as needed for nausea or vomiting., Disp: 12 tablet, Rfl: 0 .  ranitidine (ZANTAC) 150 MG tablet, Take 1 tablet (150 mg total) by mouth 2 (two) times daily., Disp: 60 tablet, Rfl: 0 .  sertraline (ZOLOFT) 100 MG tablet, TAKE 1 TABLET BY MOUTH EVERY DAY, Disp: 30 tablet, Rfl: 2 .  sucralfate (CARAFATE) 1 g tablet, Take 1 tablet (1 g total) by mouth 4 (four) times daily., Disp: 60 tablet, Rfl: 0  Review of Systems  Constitutional: Negative for appetite change, chills, fatigue and fever.  Respiratory: Negative for chest tightness and shortness of breath.   Cardiovascular: Negative for chest pain and palpitations.  Gastrointestinal: Negative for abdominal pain, nausea and vomiting.  Neurological:  Negative for dizziness and weakness.    Social History   Tobacco Use  . Smoking status: Never Smoker  . Smokeless tobacco: Never Used  Substance Use Topics  . Alcohol use: Yes    Alcohol/week: 0.0 oz    Comment: rare   Objective:   There were no vitals taken for this visit. There were no vitals filed for this visit.   Physical Exam      Assessment & Plan:           Mila Merryonald Fisher, MD  [facility]

## 2017-08-27 ENCOUNTER — Encounter: Payer: Self-pay | Admitting: Family Medicine

## 2017-08-27 ENCOUNTER — Ambulatory Visit: Payer: BC Managed Care – PPO | Admitting: Family Medicine

## 2017-08-27 VITALS — BP 124/70 | HR 73 | Temp 98.9°F | Resp 16 | Wt 186.0 lb

## 2017-08-27 DIAGNOSIS — H6122 Impacted cerumen, left ear: Secondary | ICD-10-CM

## 2017-08-27 DIAGNOSIS — F410 Panic disorder [episodic paroxysmal anxiety] without agoraphobia: Secondary | ICD-10-CM | POA: Diagnosis not present

## 2017-08-27 NOTE — Progress Notes (Signed)
Patient: Jo Perkins Female    DOB: 01/29/1986   31 y.o.   MRN: [id number] Visit Date: 08/27/2017  Today's Provider: Lelon Huh, MD   Chief Complaint  Patient presents with  . Panic Attack    follow up   Subjective:    HPI  Panic Form 02/08/2016- advised her she could try cutting dose to 1/2 tablet a day. I would not recommend stopping all together. Refill clonazepam. Patient reports good compliance with treatment, good tolerance and good symptom control. She usually only needs to take clonazepam a few times a month, but has has more job related stress with the holidays coming and has been taking a couple times each week recently. She feels sertraline has been working very well and wishes to continue.   She also complains of fullness in her left ear and a little trouble hearing. She states she went to urgent care and was told to use mineral oil.     Allergies  Allergen Reactions  . Imuran  [Azathioprine]     caused pancreatitis     Current Outpatient Medications:  .  clonazePAM (KLONOPIN) 0.5 MG tablet, TAKE 1 TABLET BY MOUTH IN THE MORNING AND 1/2 TAB ONCE TO TWICE A DAY AS NEEDED, Disp: 30 tablet, Rfl: 1 .  HUMIRA PEN-CROHNS STARTER 40 MG/0.8ML PNKT, , Disp: , Rfl:  .  oxyCODONE (OXY IR/ROXICODONE) 5 MG immediate release tablet, Take 1 tablet (5 mg total) by mouth every 4 (four) hours as needed for moderate pain. (Patient not taking: Reported on 05/14/2016), Disp: 30 tablet, Rfl: 0 .  predniSONE (STERAPRED UNI-PAK 21 TAB) 10 MG (21) TBPK tablet, Take 2 pills daily (71m) in the morning for 2 weeks, then taper by 1 pill daily (10 mg) for  2 weeks., Disp: 42 tablet, Rfl: 0 .  promethazine (PHENERGAN) 12.5 MG tablet, Take 1 tablet (12.5 mg total) by mouth every 6 (six) hours as needed for nausea or vomiting., Disp: 12 tablet, Rfl: 0 .  ranitidine (ZANTAC) 150 MG tablet, Take 1 tablet (150 mg total) by mouth 2 (two) times daily., Disp: 60 tablet, Rfl: 0 .  sertraline  (ZOLOFT) 100 MG tablet, TAKE 1 TABLET BY MOUTH EVERY DAY, Disp: 30 tablet, Rfl: 2 .  sucralfate (CARAFATE) 1 g tablet, Take 1 tablet (1 g total) by mouth 4 (four) times daily., Disp: 60 tablet, Rfl: 0  Review of Systems  Constitutional: Negative for appetite change, chills, fatigue and fever.  Respiratory: Negative for chest tightness and shortness of breath.   Cardiovascular: Negative for chest pain and palpitations.  Gastrointestinal: Negative for abdominal pain, nausea and vomiting.  Neurological: Negative for dizziness and weakness.    Social History   Tobacco Use  . Smoking status: Never Smoker  . Smokeless tobacco: Never Used  Substance Use Topics  . Alcohol use: Yes    Alcohol/week: 0.0 oz    Comment: rare   Objective:   BP 124/70 (BP Location: Left Arm, Patient Position: Sitting, Cuff Size: Large)   Pulse 73   Temp 98.9 F (37.2 C) (Oral)   Resp 16   Wt 186 lb (84.4 kg)   LMP  (Within Days)   SpO2 98% Comment: room air  BMI 28.28 kg/m  There were no vitals filed for this visit.   Physical Exam  General Appearance:    Alert, cooperative, no distress  HENT:   Left ear canal obstructed with cerumen.   Eyes:  PERRL, conjunctiva/corneas clear, EOM's intact       Lungs:     Clear to auscultation bilaterally, respirations unlabored  Heart:    Regular rate and rhythm  Neurologic:   Awake, alert, oriented x 3. No apparent focal neurological           defect.           Assessment & Plan:     1. Panic disorder Doing well with current regiment of sertraline and prn clonazepam which we will continue.   2. Impacted cerumen of left ear Recommend she try OTC Debrox. She can return for irrigation if it becomes more bothersome or if Debrox not effective.   Recommended flu vaccine which she declined today. Discussed routine health screening. She states she is in process of setting up CPE at [facility].        Lelon Huh, MD  [facility]

## 2017-08-27 NOTE — Patient Instructions (Addendum)
   Try OTC Debrox ear drops to keep wax clear   I recommend that you get a flu vaccine this year. Please call our office at [phone] at your earliest convenience to schedule a flu shot, or go to your neighborhood pharmacy

## 2017-09-03 ENCOUNTER — Other Ambulatory Visit: Payer: Self-pay | Admitting: Family Medicine

## 2017-09-03 MED ORDER — SERTRALINE HCL 100 MG PO TABS: 100.0000 mg | ORAL_TABLET | Freq: Every day | ORAL | 3 refills | Status: DC

## 2017-09-03 NOTE — Telephone Encounter (Signed)
[facility] faxed a request for a refill for a 90-days supply for the following medication. Thanks CC  sertraline (ZOLOFT) 100 MG tablet

## 2017-09-03 NOTE — Telephone Encounter (Signed)
Requesting 90 day supply.

## 2017-09-15 ENCOUNTER — Other Ambulatory Visit: Payer: Self-pay | Admitting: Family Medicine

## 2017-12-17 IMAGING — CT CT ABD-PELV W/ CM
2 of 4 series · 16 of 46 positions shown, 18 images · IV contrast (iopamidol)
Comparison: None.

CLINICAL DATA: Abdominal pain with nausea and diarrhea for 3 days.
Blood in stool. Personal history of ulcerative colitis.

EXAM:
CT ABDOMEN AND PELVIS WITH CONTRAST
TECHNIQUE: Multidetector CT imaging of the abdomen and pelvis was performed
using the standard protocol following bolus administration of
intravenous contrast.
CONTRAST:  100mL [id number] IOPAMIDOL (M67XMM-GXX) INJECTION 61%

[Series 2: axial st · axial · 0.78mm/px · z∈[-533,-83]mm · 13 of 100 slices shown, 15 images]
[im 5/100  soft-tissue]
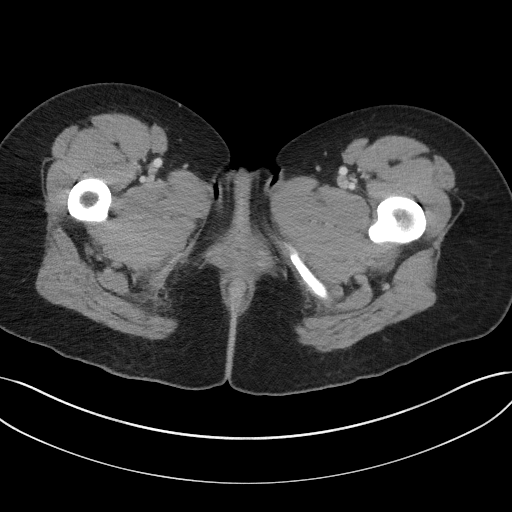
[im 5/100  bone]
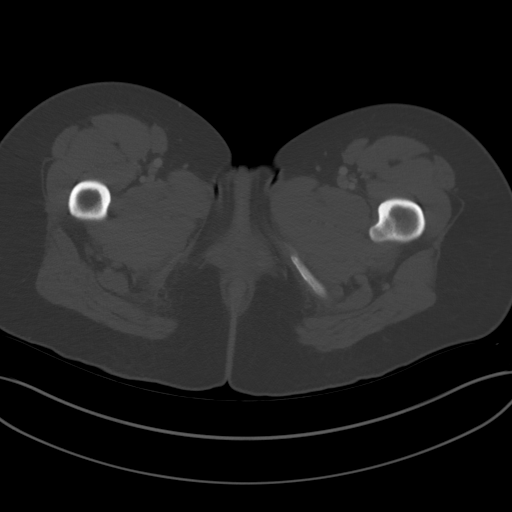
[im 13/100  soft-tissue]
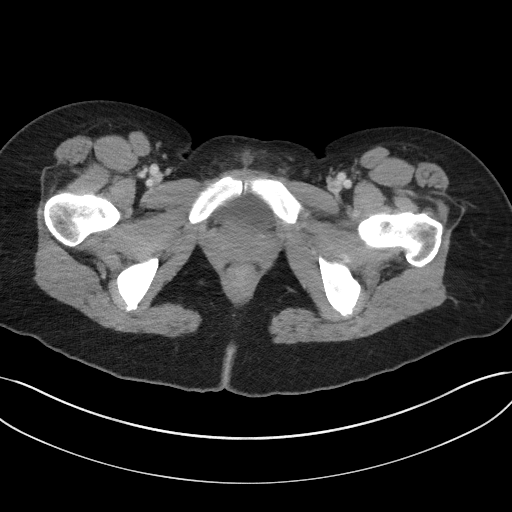
[im 21/100  soft-tissue]
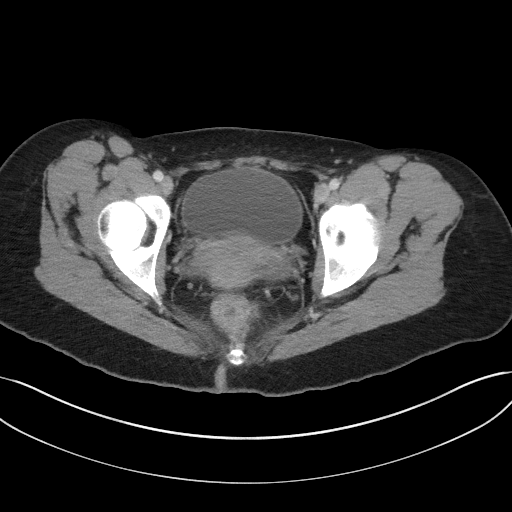
[im 29/100  soft-tissue]
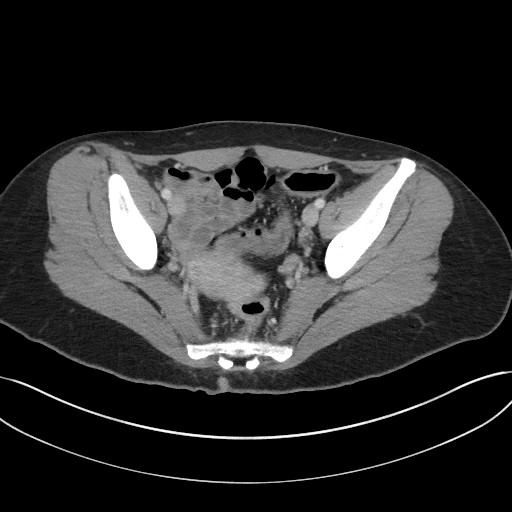
[im 34/100  soft-tissue]
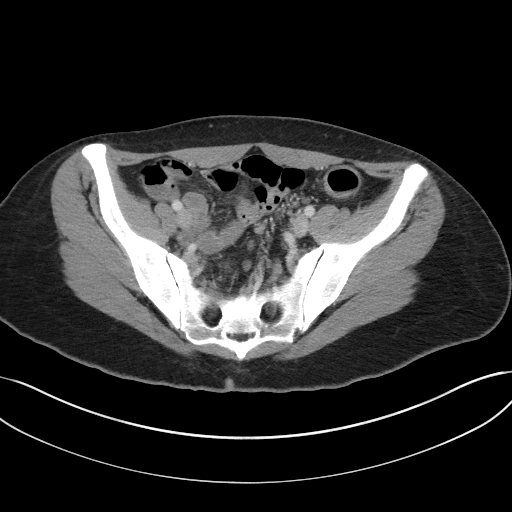
[im 42/100  soft-tissue]
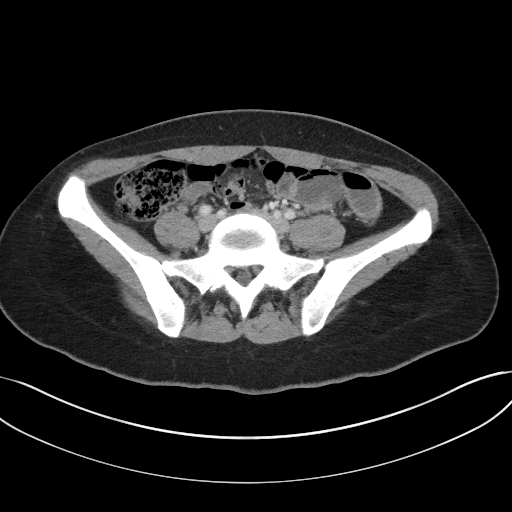
[im 50/100  soft-tissue]
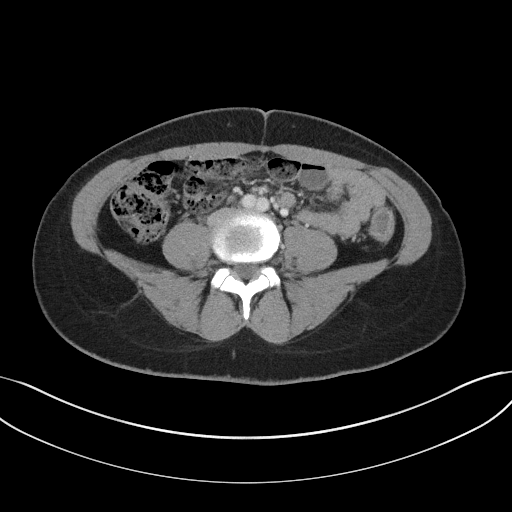
[im 58/100  soft-tissue]
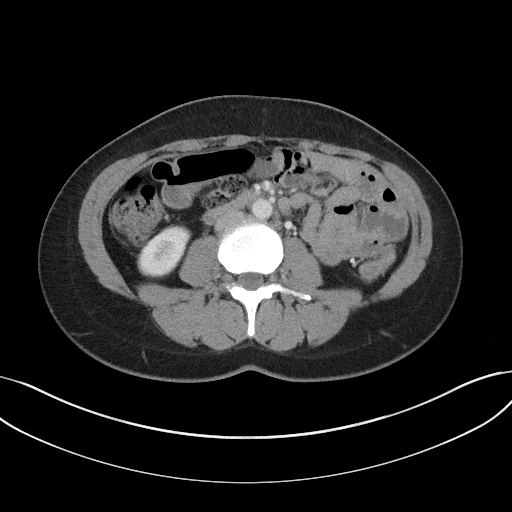
[im 67/100  soft-tissue]
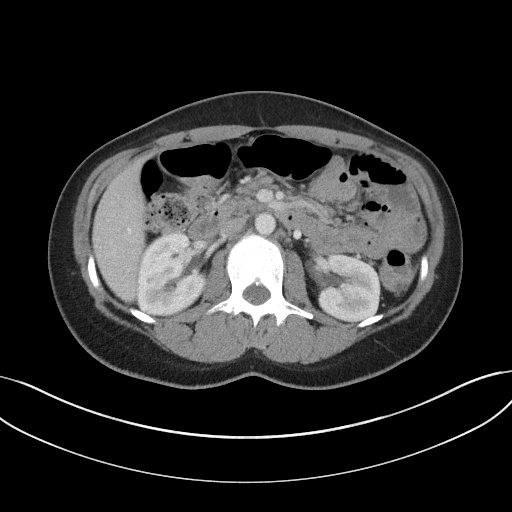
[im 67/100  bone]
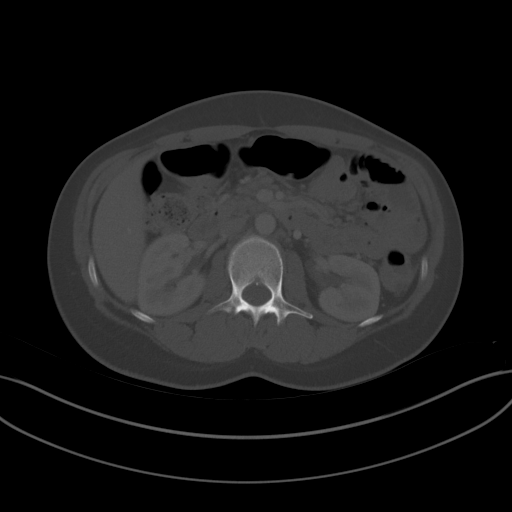
[im 71/100  soft-tissue]
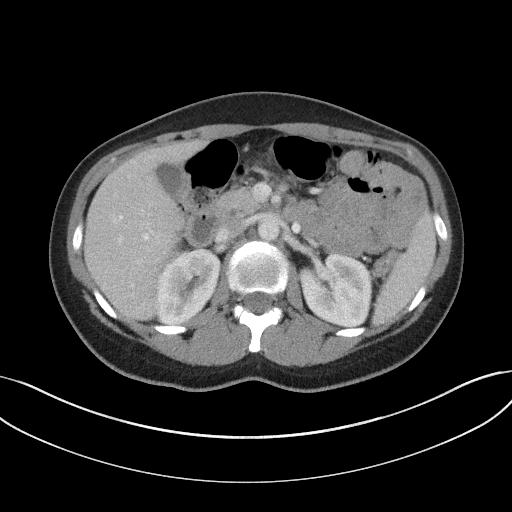
[im 79/100  soft-tissue]
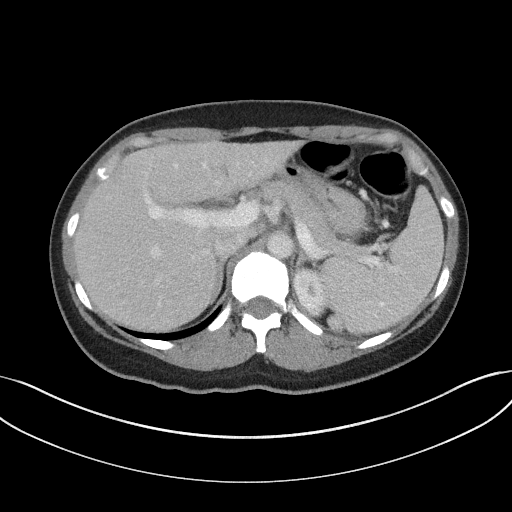
[im 87/100  soft-tissue]
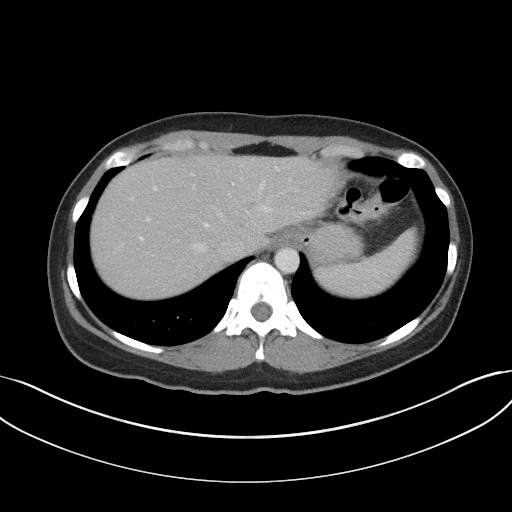
[im 95/100  soft-tissue]
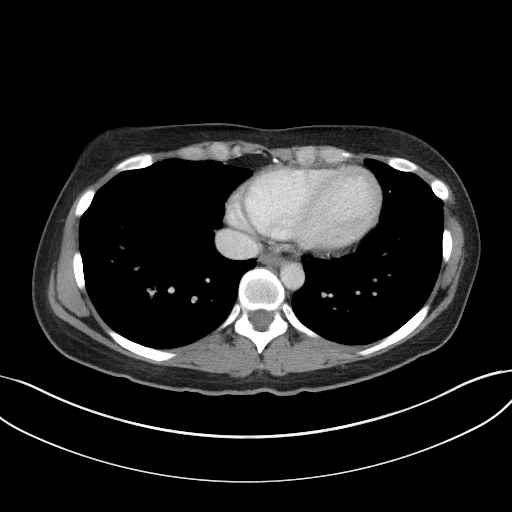

[Series 5: coronal st · coronal · 0.83mm/px · 3 of 70 slices shown]
[im 24/70  soft-tissue]
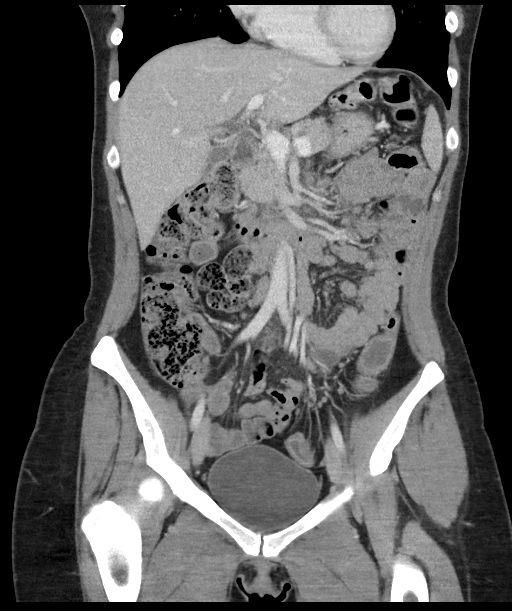
[im 31/70  soft-tissue]
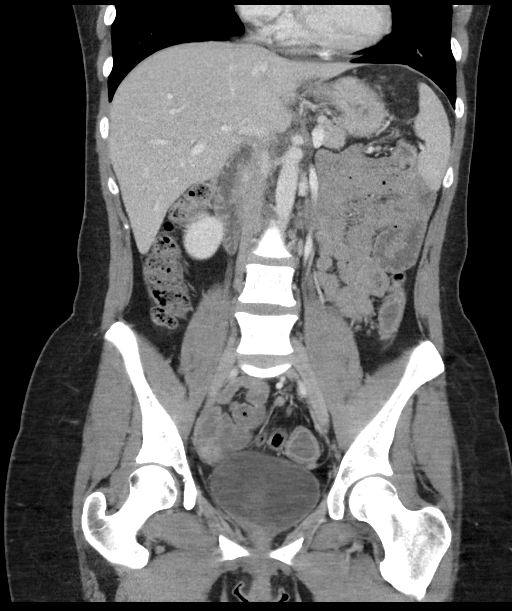
[im 39/70  soft-tissue]
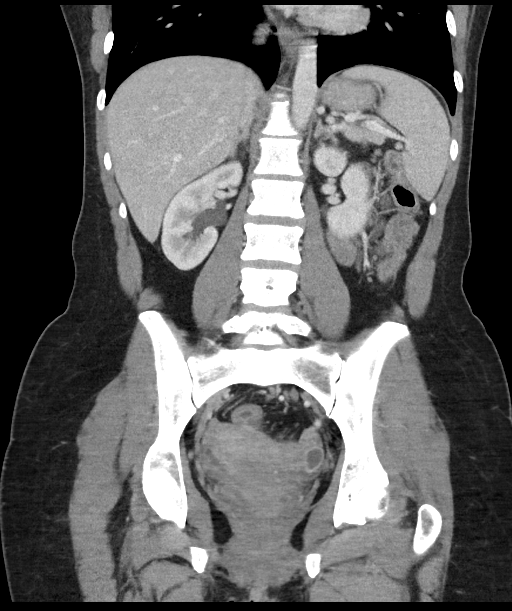

[16 of 46 positions shown; findings below may reference images not displayed]

FINDINGS: Lower chest: The lung bases are clear without focal nodule, mass, or
airspace disease. The heart size is normal. No significant pleural
or pericardial effusion is present.

Hepatobiliary: The liver is unremarkable. No focal lesions are
present. The common bile duct and gallbladder are within normal
limits.

Pancreas: Unremarkable. No pancreatic ductal dilatation or
surrounding inflammatory changes.

Spleen: Spleen is unremarkable. A splenule is noted posterior and
medial to the spleen.

Adrenals/Urinary Tract: The adrenal glands are normal bilaterally.
Kidneys and ureters are within normal limits. The urinary bladder is
unremarkable.

Stomach/Bowel: The distal esophagus is mildly dilated. The stomach
is unremarkable. The duodenum is within normal limits. Small bowel
is within normal limits. The terminal ileum is normal. The ascending
and transverse colon are within normal limits. There is a lack of
haustral markings in the descending colon compatible with ulcerative
colitis. No significant inflammatory changes are present. There is
no mass lesion.

Vascular/Lymphatic: No significant vascular findings are present. No
enlarged abdominal or pelvic lymph nodes.

Reproductive: Uterus is mildly edematous. Please correlate with
menstrual cycle. A 2 cm follicle in the left ovary demonstrates
peripheral enhancement, likely representing recently ruptured
follicle. The adnexa are otherwise within normal limits. No
significant free fluid is present.

Other: No significant free fluid or free air is present.

Musculoskeletal: Bone windows are unremarkable.
IMPRESSION: 1. Featureless appearance of the descending and sigmoid colon
compatible with the given diagnosis of ulcerative colitis.
2. No focal inflammation or mass lesion.
3. No other acute abnormality to explain the patient's symptoms.

## 2018-03-28 ENCOUNTER — Other Ambulatory Visit: Payer: Self-pay | Admitting: Family Medicine

## 2018-10-29 ENCOUNTER — Other Ambulatory Visit: Payer: Self-pay | Admitting: Family Medicine

## 2019-03-30 ENCOUNTER — Other Ambulatory Visit: Payer: Self-pay | Admitting: Family Medicine

## 2019-04-24 ENCOUNTER — Ambulatory Visit: Payer: Self-pay | Admitting: Family Medicine

## 2019-04-27 ENCOUNTER — Other Ambulatory Visit: Payer: Self-pay

## 2019-04-27 ENCOUNTER — Encounter: Payer: Self-pay | Admitting: Family Medicine

## 2019-04-27 ENCOUNTER — Ambulatory Visit: Payer: BC Managed Care – PPO | Admitting: Family Medicine

## 2019-04-27 VITALS — BP 116/74 | HR 60 | Temp 98.5°F | Resp 16 | Ht 68.0 in | Wt 189.0 lb

## 2019-04-27 DIAGNOSIS — F410 Panic disorder [episodic paroxysmal anxiety] without agoraphobia: Secondary | ICD-10-CM | POA: Diagnosis not present

## 2019-04-27 MED ORDER — SERTRALINE HCL 100 MG PO TABS: 100.0000 mg | ORAL_TABLET | Freq: Every day | ORAL | 3 refills | Status: DC

## 2019-04-27 MED ORDER — ALPRAZOLAM 1 MG PO TABS: 1.0000 mg | ORAL_TABLET | Freq: Four times a day (QID) | ORAL | 0 refills | Status: DC | PRN

## 2019-04-27 NOTE — Progress Notes (Signed)
Patient: Jo Perkins Female    DOB: 1986-08-12   33 y.o.   MRN: [id number] Visit Date: 04/27/2019  Today's Provider: Lelon Huh, MD   Chief Complaint  Patient presents with  . Anxiety   Subjective:     HPI Patient here today to follow up her anxiety. Patient reports that her anxiety has worsened in the last two months. 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 Patient reports she has been taking clonazepam 0.5 two tablets at one time to help with symptoms, and continues taking sertraline 100mg  every day. She apparently had a stockpile, so hasn't needed a prescription refill approval since 2018. Sertraline had been working well for panic attacks until the last few weeks. States she feels extremely fatigued after having a panic attack and has not been exercising as much due to fatigue.   Allergies  Allergen Reactions  . Imuran  [Azathioprine]     caused pancreatitis     Current Outpatient Medications:  .  clonazePAM (KLONOPIN) 0.5 MG tablet, TAKE 1 TABLET BY MOUTH IN THE MORNING AND 1/2 TAB ONCE TO TWICE A DAY AS NEEDED, Disp: 30 tablet, Rfl: 4 .  HUMIRA PEN-CROHNS STARTER 40 MG/0.8ML PNKT, , Disp: , Rfl:  .  sertraline (ZOLOFT) 100 MG tablet, Take 1 tablet (100 mg total) by mouth daily., Disp: 90 tablet, Rfl: 3  Review of Systems  Constitutional: Positive for activity change and fatigue.  Cardiovascular: Negative.   Psychiatric/Behavioral: Positive for decreased concentration. The patient is nervous/anxious.     Social History   Tobacco Use  . Smoking status: Never Smoker  . Smokeless tobacco: Never Used  Substance Use Topics  . Alcohol use: Yes    Alcohol/week: 0.0 standard drinks    Comment: rare      Objective:   BP 116/74 (BP Location: Left Arm, Patient Position: Sitting, Cuff Size: Normal)   Pulse 60   Temp 98.5 F (36.9 C) (Oral)    Resp 16   Ht 5\' 8"  (1.727 m)   Wt 189 lb (85.7 kg)   BMI 28.74 kg/m  Vitals:   04/27/19 1600  BP: 116/74  Pulse: 60  Resp: 16  Temp: 98.5 F (36.9 C)  TempSrc: Oral  Weight: 189 lb (85.7 kg)  Height: 5\' 8"  (1.727 m)   GAD 7 : Generalized Anxiety Score 04/27/2019  Nervous, Anxious, on Edge 3  Control/stop worrying 2  Worry too much - different things 3  Trouble relaxing 2  Restless 1  Easily annoyed or irritable 1  Afraid - awful might happen 1  Total GAD 7 Score 13  Anxiety Difficulty Somewhat difficult    Depression screen PHQ 2/9 04/27/2019  Decreased Interest 2  Down, Depressed, Hopeless 1  PHQ - 2 Score 3  Altered sleeping 2  Tired, decreased energy 3  Change in appetite 2  Feeling bad or failure about yourself  1  Trouble concentrating 2  Moving slowly or fidgety/restless 1  Suicidal thoughts 0  PHQ-9 Score 14  Difficult doing work/chores Somewhat difficult     Physical Exam  General appearance: alert, well developed, well nourished, cooperative and in no distress Head: Normocephalic, without obvious abnormality, atraumatic Respiratory: Respirations even and unlabored, normal respiratory rate Extremities: No gross deformities Skin: Skin color, texture, turgor normal. No rashes seen  Psych: Appropriate mood and affect. Neurologic: Mental  status: Alert, oriented to person, place, and time, thought content appropriate.      Assessment & Plan    1. 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 Will titrated sertraline - sertraline (ZOLOFT) 100 MG tablet; Take 1-2 tablets (100-200 mg total) by mouth daily.  Dispense: 90 tablet; Refill: 3 She would like something that works quicker than clonazepam that doesn't leave her so fatigued, can try- ALPRAZolam (XANAX) 1 MG tablet; Take 1 tablet (1 mg total) by mouth every 6 (six) hours as needed (panic).  Dispense: 30 tablet; Refill: 0  Will call in a few weeks to see  how this change is working for a few weeks.     Mila Merryonald Fisher, MD  [facility]

## 2019-04-27 NOTE — Patient Instructions (Addendum)
.   Please review the attached list of medications and notify my office if there are any errors.   . Please bring all of your medications to every appointment so we can make sure that our medication list is the same as yours.   . We will have flu vaccines available after Labor Day. Please go to your pharmacy or call the office in early September to schedule you flu shot.   Titrale sertraline up to 200mg  a day for a month or two. Start by increasing to 1 1/2 tablets for 4-6 days, then increase to 2 tablets daily   You can take alprazolam OR clonazepam as needed, but not both

## 2019-05-15 ENCOUNTER — Other Ambulatory Visit: Payer: Self-pay | Admitting: Family Medicine

## 2019-05-19 ENCOUNTER — Telehealth: Payer: Self-pay | Admitting: Family Medicine

## 2019-05-19 NOTE — Telephone Encounter (Signed)
Patient was seen in early august and had sertraline increased for panic attacks and started prn alprazolam. Please check and see if this is working and how much sertraline she has been taking. If doing better will send in refills.

## 2019-05-19 NOTE — Telephone Encounter (Signed)
Spoke with patient on the phone who states that she has had good compliance on Sertraline and symptom control is improving on medication. Patient reports that she increased medication to 1.5 tablet daily for one week and than increased to two tablets daily. Patient states that she is still taking Alprazolam PRN but reports that her mood is better than before. KW Patient uses [facility] located on [address]. KW

## 2019-07-13 ENCOUNTER — Telehealth: Payer: Self-pay | Admitting: Family Medicine

## 2019-07-13 ENCOUNTER — Other Ambulatory Visit: Payer: Self-pay

## 2019-07-13 ENCOUNTER — Encounter: Payer: Self-pay | Admitting: Family Medicine

## 2019-07-13 ENCOUNTER — Ambulatory Visit: Payer: BC Managed Care – PPO | Admitting: Family Medicine

## 2019-07-13 VITALS — BP 117/78 | HR 76 | Temp 96.9°F | Wt 196.2 lb

## 2019-07-13 DIAGNOSIS — F988 Other specified behavioral and emotional disorders with onset usually occurring in childhood and adolescence: Secondary | ICD-10-CM | POA: Diagnosis not present

## 2019-07-13 DIAGNOSIS — F410 Panic disorder [episodic paroxysmal anxiety] without agoraphobia: Secondary | ICD-10-CM | POA: Diagnosis not present

## 2019-07-13 DIAGNOSIS — Z23 Encounter for immunization: Secondary | ICD-10-CM

## 2019-07-13 MED ORDER — AMPHETAMINE-DEXTROAMPHETAMINE 5 MG PO TABS: ORAL_TABLET | ORAL | 0 refills | Status: DC

## 2019-07-13 NOTE — Telephone Encounter (Signed)
Pt called saying her insurance called her and told her they would need a PA for the Adderall.  They faxed a Pa to be sent back in this morning.  Con Memos

## 2019-07-13 NOTE — Patient Instructions (Addendum)
.   Please review the attached list of medications and notify my office if there are any errors.   . Please bring all of your medications to every appointment so we can make sure that our medication list is the same as yours.   Burnis Medin give you a call in about 3 weeks to see how the new medication is working and whether any changes need to be made

## 2019-07-13 NOTE — Telephone Encounter (Signed)
Pt called asking if the PA has been done yet for her Adderall  Pt would like someone to call her when it is done.  [phone]  Con Memos

## 2019-07-13 NOTE — Progress Notes (Signed)
Patient: Jo Perkins Female    DOB: June 05, 1986   33 y.o.   MRN: [id number] Visit Date: 07/13/2019  Today's Provider: Lelon Huh, MD   Chief Complaint  Patient presents with  . 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 Patient states she can not concentrate on one task at a time. . Often looses track of what she wants to say during conversations. States she has always had a little trouble staying focused, but has been much more of a problem the last few months.   On 04/27/2019 patient advised to titrate Sertraline and start Alprazolam PRN for panic disorder. She states she did much better after increasing sertraline to 200. She has adjusted to virtual teaching and has been able to go back down to 145m a day. She finished alprazolam which she states did work well. She still has prescription for clonazepam but states she rarely needs to take it.   Allergies  Allergen Reactions  . Imuran  [Azathioprine]     caused pancreatitis     Current Outpatient Medications:  .  clonazePAM (KLONOPIN) 0.5 MG tablet, TAKE 1 TABLET BY MOUTH IN THE MORNING AND 1/2 TAB ONCE TO TWICE A DAY AS NEEDED, Disp: 30 tablet, Rfl: 3 .  HUMIRA PEN-CROHNS STARTER 40 MG/0.8ML PNKT, , Disp: , Rfl:  .  sertraline (ZOLOFT) 100 MG tablet, Take 1-2 tablets (100-200 mg total) by mouth daily., Disp: 90 tablet, Rfl: 3 .  ALPRAZolam (XANAX) 1 MG tablet, Take 1 tablet (1 mg total) by mouth every 6 (six) hours as needed (panic). (Patient not taking: Reported on 07/13/2019), Disp: 30 tablet, Rfl: 0  Review of Systems  Constitutional: Negative.   Respiratory: Negative.   Cardiovascular: Negative.   Musculoskeletal: Negative.   Psychiatric/Behavioral: Positive for decreased concentration.    Social History   Tobacco Use  . Smoking status: Never Smoker  . Smokeless tobacco: Never Used  Substance Use Topics   . Alcohol use: Yes    Alcohol/week: 0.0 standard drinks    Comment: rare      Objective:   BP 117/78 (BP Location: Right Arm, Patient Position: Sitting, Cuff Size: Normal)   Pulse 76   Temp (!) 96.9 F (36.1 C) (Temporal)   Wt 196 lb 3.2 oz (89 kg)   SpO2 97%   BMI 29.83 kg/m  Vitals:   07/13/19 0848  BP: 117/78  Pulse: 76  Temp: (!) 96.9 F (36.1 C)  TempSrc: Temporal  SpO2: 97%  Weight: 196 lb 3.2 oz (89 kg)  Body mass index is 29.83 kg/m.   Physical Exam   General appearance:  Awake, alert, cooperative and in no acute distress Head: Normocephalic, without obvious abnormality, atraumatic Respiratory: Respirations even and unlabored, normal respiratory rate Extremities: All extremities are intact.  Skin: Skin color, texture, turgor normal. No rashes seen  Psych: Appropriate mood and affect. Neurologic: Mental status: Alert, oriented to person, place, and time, thought content appropriate.      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  Start amphetamine-dextroamphetamine (ADDERALL) 5 MG tablet; Take one tablet every morning for a week, then one tablet twice a day  Dispense: 60 tablet; Refill: 0  Will call for follow up in about 3 weeks.   2. Panic disorder Well controlled, is  back down to 132m sertraline daily and occasional prn clonazepam.   Other orders - Flu Vaccine QUAD 6+ mos PF IM (Fluarix Quad PF)      DLelon Huh MD  [facility]

## 2019-07-13 NOTE — Telephone Encounter (Signed)
Attempted to contact patient, no answer nor voicemail to let her know PA's can take a couple days for approval or denial.

## 2019-07-15 NOTE — Telephone Encounter (Signed)
PA approved for 3 years. Will expire 07/14/2022. Patient notified.

## 2019-07-30 ENCOUNTER — Other Ambulatory Visit: Payer: Self-pay | Admitting: Family Medicine

## 2019-07-30 ENCOUNTER — Telehealth: Payer: Self-pay | Admitting: Family Medicine

## 2019-07-30 DIAGNOSIS — F988 Other specified behavioral and emotional disorders with onset usually occurring in childhood and adolescence: Secondary | ICD-10-CM

## 2019-07-30 NOTE — Telephone Encounter (Signed)
Patient states the current dose of Adderall 5mg  twice daily has helped to improve symptoms, but the 2nd pill in the evening doesn't seem to be effective. Patient would like to increase the dose with the next fill if possible.

## 2019-07-30 NOTE — Telephone Encounter (Signed)
Patient was started on Adderall of ADD last month. Please check and see if 5mg  twice a day is working ok of she feels she needs to adjust the dose before next refill. Thanks.

## 2019-08-04 MED ORDER — AMPHETAMINE-DEXTROAMPHETAMINE 10 MG PO TABS: 10.0000 mg | ORAL_TABLET | Freq: Two times a day (BID) | ORAL | 0 refills | Status: DC

## 2019-08-04 NOTE — Telephone Encounter (Signed)
Patient advised. Appointment scheduled.  

## 2019-08-04 NOTE — Telephone Encounter (Signed)
Have sent prescription for 10mg  tablets to [facility] [address]. Need to schedule follow up appt. In about 4 weeks.

## 2019-08-08 ENCOUNTER — Other Ambulatory Visit: Payer: Self-pay | Admitting: Family Medicine

## 2019-08-08 DIAGNOSIS — F410 Panic disorder [episodic paroxysmal anxiety] without agoraphobia: Secondary | ICD-10-CM

## 2019-08-10 NOTE — Telephone Encounter (Signed)
Appears to be taking clonazepam, not Xanax.  This was filled only 2 weeks ago.

## 2019-09-07 ENCOUNTER — Ambulatory Visit: Payer: BC Managed Care – PPO | Admitting: Family Medicine

## 2019-09-07 ENCOUNTER — Encounter: Payer: Self-pay | Admitting: Family Medicine

## 2019-09-07 ENCOUNTER — Other Ambulatory Visit: Payer: Self-pay

## 2019-09-07 VITALS — BP 110/71 | HR 63 | Temp 97.1°F | Wt 197.4 lb

## 2019-09-07 DIAGNOSIS — K5190 Ulcerative colitis, unspecified, without complications: Secondary | ICD-10-CM

## 2019-09-07 DIAGNOSIS — F988 Other specified behavioral and emotional disorders with onset usually occurring in childhood and adolescence: Secondary | ICD-10-CM

## 2019-09-07 DIAGNOSIS — K861 Other chronic pancreatitis: Secondary | ICD-10-CM | POA: Diagnosis not present

## 2019-09-07 DIAGNOSIS — F410 Panic disorder [episodic paroxysmal anxiety] without agoraphobia: Secondary | ICD-10-CM

## 2019-09-07 NOTE — Progress Notes (Signed)
Patient: Jo Perkins Female    DOB: July 12, 1986   33 y.o.   MRN: [id number] Visit Date: 09/07/2019  Today's Provider: Lelon Huh, MD   Chief Complaint  Patient presents with  . ADD   Subjective:     HPI  Follow up for ADD:  The patient was last seen for this 2 months ago. Changes made at last visit include starting Adderall 71m twice daily; dosage was later increased to 149mtwice daily which she feels is working very well. Only takes during the work week.   She reports good compliance with treatment. She feels that condition is Improved. She is not having side effects.  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  ------------------------------------------------------------------------------------  Allergies  Allergen Reactions  . Imuran  [Azathioprine]     caused pancreatitis     Current Outpatient Medications:  .  amphetamine-dextroamphetamine (ADDERALL) 10 MG tablet, Take 1 tablet (10 mg total) by mouth 2 (two) times daily., Disp: 60 tablet, Rfl: 0 .  clonazePAM (KLONOPIN) 0.5 MG tablet, TAKE 1 TABLET BY MOUTH IN THE MORNING AND 1/2 TAB ONCE TO TWICE A DAY AS NEEDED, Disp: 30 tablet, Rfl: 3 .  HUMIRA PEN-CROHNS STARTER 40 MG/0.8ML PNKT, , Disp: , Rfl:  .  sertraline (ZOLOFT) 100 MG tablet, Take 1-2 tablets (100-200 mg total) by mouth daily. (Patient taking differently: Take 100 mg by mouth daily. ), Disp: 90 tablet, Rfl: 3  Review of Systems  Constitutional: Negative for appetite change, chills, fatigue and fever.  Respiratory: Negative for chest tightness and shortness of breath.   Cardiovascular: Negative for chest pain and palpitations.  Gastrointestinal: Negative for abdominal pain, nausea and vomiting.  Neurological: Negative for dizziness and weakness.    Social History   Tobacco Use  . Smoking status: Never Smoker  . Smokeless tobacco: Never Used   Substance Use Topics  . Alcohol use: Yes    Alcohol/week: 0.0 standard drinks    Comment: rare      Objective:   BP 110/71 (BP Location: Right Arm, Patient Position: Sitting, Cuff Size: Normal)   Pulse 63   Temp (!) 97.1 F (36.2 C) (Temporal)   Wt 197 lb 6.4 oz (89.5 kg)   SpO2 98%   BMI 30.01 kg/m  Vitals:   09/07/19 1438  BP: 110/71  Pulse: 63  Temp: (!) 97.1 F (36.2 C)  TempSrc: Temporal  SpO2: 98%  Weight: 197 lb 6.4 oz (89.5 kg)  Body mass index is 30.01 kg/m.   Physical Exam  General appearance: Well developed, well nourished female, cooperative and in no acute distress Head: Normocephalic, without obvious abnormality, atraumatic Respiratory: Respirations even and unlabored, normal respiratory rate Extremities: All extremities are intact.  Skin: Skin color, texture, turgor normal. No rashes seen  Psych: Appropriate mood and affect. Neurologic: Mental status: Alert, oriented to person, place, and time, thought content appropriate.       Assessment & Plan    1. Attention deficit disorder (ADD) without hyperactivity Doing very well on 69m8mderall Bid. Continue current medications.    2. Panic disorder Sx completely controlled on 100mg25may of sertraline.   3. Chronic pancreatitis, unspecified pancreatitis type (HCC) [place]. Ulcerative colitis without complications, unspecified location (HCC)[facility] entirety of the information documented in the History  of Present Illness, Review of Systems and Physical Exam were personally obtained by me. Portions of this information were initially documented by Idelle Jo, CMA and reviewed by me for thoroughness and accuracy.     Lelon Huh, MD  [facility]

## 2019-09-21 ENCOUNTER — Telehealth: Payer: Self-pay | Admitting: Family Medicine

## 2019-09-21 MED ORDER — AMPHETAMINE-DEXTROAMPHETAMINE 10 MG PO TABS: 10.0000 mg | ORAL_TABLET | Freq: Two times a day (BID) | ORAL | 0 refills | Status: DC

## 2019-09-21 NOTE — Telephone Encounter (Signed)
Last OV: 09/07/2019 Last refill 08/04/2019 30days 0 RF

## 2019-09-21 NOTE — Telephone Encounter (Signed)
Pt needs refill on generic adderall 10 mg. [facility]  in [address]

## 2019-10-19 ENCOUNTER — Other Ambulatory Visit: Payer: Self-pay | Admitting: Family Medicine

## 2019-10-19 MED ORDER — AMPHETAMINE-DEXTROAMPHETAMINE 10 MG PO TABS: 10.0000 mg | ORAL_TABLET | Freq: Two times a day (BID) | ORAL | 0 refills | Status: DC

## 2019-10-19 NOTE — Telephone Encounter (Signed)
Copied from [place] [id number]. Topic: Quick Communication - Rx Refill/Question >> Oct 19, 2019  8:48 AM Leward Quan A wrote: Medication: amphetamine-dextroamphetamine (ADDERALL) 10 MG tablet   Has the patient contacted their pharmacy? Yes.   (Agent: If no, request that the patient contact the pharmacy for the refill.) (Agent: If yes, when and what did the pharmacy advise?)  Preferred Pharmacy (with phone number or street name): [id number] [address], [place]  Phone:  [phone] Fax:  [phone]     Agent: Please be advised that RX refills may take up to 3 business days. We ask that you follow-up with your pharmacy.

## 2019-10-19 NOTE — Telephone Encounter (Signed)
Requested medication (s) are due for refill today: yes  Requested medication (s) are on the active medication list: yes  Last refill:  09/21/19  Future visit scheduled: no  Notes to clinic:  not delegated    Requested Prescriptions  Pending Prescriptions Disp Refills   amphetamine-dextroamphetamine (ADDERALL) 10 MG tablet 60 tablet 0    Sig: Take 1 tablet (10 mg total) by mouth 2 (two) times daily.      Not Delegated - Psychiatry:  Stimulants/ADHD Failed - 10/19/2019  8:51 AM      Failed - This refill cannot be delegated      Failed - Urine Drug Screen completed in last 360 days.      Passed - Valid encounter within last 3 months    Recent Outpatient Visits           1 month ago Attention deficit disorder (ADD) without hyperactivity   [place], [place]   3 months ago Attention deficit disorder (ADD) without hyperactivity   [facility] Birdie Sons, MD   5 months ago Panic disorder   [facility] Birdie Sons, MD   2 years ago Panic disorder   [facility] Birdie Sons, MD   3 years ago Other acute pancreatitis   [facility] Birdie Sons, MD

## 2019-11-13 ENCOUNTER — Other Ambulatory Visit: Payer: Self-pay | Admitting: Family Medicine

## 2019-11-13 DIAGNOSIS — F988 Other specified behavioral and emotional disorders with onset usually occurring in childhood and adolescence: Secondary | ICD-10-CM

## 2019-11-13 MED ORDER — AMPHETAMINE-DEXTROAMPHETAMINE 10 MG PO TABS: 10.0000 mg | ORAL_TABLET | Freq: Two times a day (BID) | ORAL | 0 refills | Status: DC

## 2019-11-13 NOTE — Telephone Encounter (Signed)
Medication Refill - Medication: adderall  Has the patient contacted their pharmacy? Yes.   (Agent: If no, request that the patient contact the pharmacy for the refill.) (Agent: If yes, when and what did the pharmacy advise?)  Preferred Pharmacy (with phone number or street name):  [facility] #[id number] [place], [place] - [address] AT [facility] [address]  [address] [place] [address]  Phone: [phone] Fax: [phone]  Not a 24 hour pharmacy; exact hours not known.     Agent: Please be advised that RX refills may take up to 3 business days. We ask that you follow-up with your pharmacy.

## 2019-11-13 NOTE — Telephone Encounter (Signed)
Patient last seen on 09/07/19 and has no f/u ov scheduled

## 2019-11-14 ENCOUNTER — Ambulatory Visit: Payer: BC Managed Care – PPO | Attending: Internal Medicine

## 2019-11-14 DIAGNOSIS — Z23 Encounter for immunization: Secondary | ICD-10-CM | POA: Insufficient documentation

## 2019-11-14 NOTE — Progress Notes (Signed)
   Covid-19 Vaccination Clinic  Name:  Jo Perkins    MRN: [id number] DOB: [birth year]  11/14/2019  [name] was observed post Covid-19 immunization for 15 minutes without incidence. She was provided with Vaccine Information Sheet and instruction to access the V-Safe system.   [name] was instructed to call 911 with any severe reactions post vaccine: Marland Kitchen Difficulty breathing  . Swelling of your face and throat  . A fast heartbeat  . A bad rash all over your body  . Dizziness and weakness    Immunizations Administered    Name Date Dose VIS Date Route   Moderna COVID-19 Vaccine 11/14/2019  1:15 PM 0.5 mL 08/18/2019 Intramuscular   Manufacturer: Moderna   Lot: [id number]   [place]: [id number]

## 2019-12-10 ENCOUNTER — Other Ambulatory Visit: Payer: Self-pay | Admitting: Family Medicine

## 2019-12-10 DIAGNOSIS — F988 Other specified behavioral and emotional disorders with onset usually occurring in childhood and adolescence: Secondary | ICD-10-CM

## 2019-12-10 NOTE — Telephone Encounter (Signed)
Requested medication (s) are due for refill today:yes   Requested medication (s) are on the active medication list: yes  Last refill: 11/13/19  Future visit scheduled: no  Notes to clinic:  not delegated    Requested Prescriptions  Pending Prescriptions Disp Refills   amphetamine-dextroamphetamine (ADDERALL) 10 MG tablet 60 tablet 0    Sig: Take 1 tablet (10 mg total) by mouth 2 (two) times daily.      Not Delegated - Psychiatry:  Stimulants/ADHD Failed - 12/10/2019 11:55 AM      Failed - This refill cannot be delegated      Failed - Urine Drug Screen completed in last 360 days.      Failed - Valid encounter within last 3 months    Recent Outpatient Visits           3 months ago Attention deficit disorder (ADD) without hyperactivity   [facility] Malva Limes, MD   5 months ago Attention deficit disorder (ADD) without hyperactivity   [facility] Malva Limes, MD   7 months ago Panic disorder   [facility] Malva Limes, MD   2 years ago Panic disorder   [facility] Malva Limes, MD   3 years ago Other acute pancreatitis   [facility] Malva Limes, MD

## 2019-12-10 NOTE — Telephone Encounter (Signed)
Requested medication (s) are due for refill today: Yes  Requested medication (s) are on the active medication list: Yes  Last refill:  11/06/19  Future visit scheduled: no  Notes to clinic:  not delegated    Requested Prescriptions  Pending Prescriptions Disp Refills   clonazePAM (KLONOPIN) 0.5 MG tablet [Pharmacy Med Name: CLONAZEPAM 0.5 MG TABLET] 30 tablet 3    Sig: TAKE 1 TABLET BY MOUTH IN THE MORNING AND 1/2 TAB ONCE TO TWICE A DAY AS NEEDED      Not Delegated - Psychiatry:  Anxiolytics/Hypnotics Failed - 12/10/2019 11:53 AM      Failed - This refill cannot be delegated      Failed - Urine Drug Screen completed in last 360 days.      Passed - Valid encounter within last 6 months    Recent Outpatient Visits           3 months ago Attention deficit disorder (ADD) without hyperactivity   [facility] Malva Limes, MD   5 months ago Attention deficit disorder (ADD) without hyperactivity   [facility] Malva Limes, MD   7 months ago Panic disorder   [facility] Malva Limes, MD   2 years ago Panic disorder   [facility] Malva Limes, MD   3 years ago Other acute pancreatitis   [facility] Malva Limes, MD

## 2019-12-10 NOTE — Telephone Encounter (Signed)
Pt needs a refill on the Adderall 89m  [place]jory Lies[place] CB#  [phone]

## 2019-12-11 MED ORDER — AMPHETAMINE-DEXTROAMPHETAMINE 10 MG PO TABS: 10.0000 mg | ORAL_TABLET | Freq: Two times a day (BID) | ORAL | 0 refills | Status: DC

## 2019-12-12 ENCOUNTER — Ambulatory Visit: Payer: BC Managed Care – PPO | Attending: Internal Medicine

## 2019-12-12 DIAGNOSIS — Z23 Encounter for immunization: Secondary | ICD-10-CM

## 2019-12-12 NOTE — Progress Notes (Signed)
   Covid-19 Vaccination Clinic  Name:  Jo Perkins    MRN: [id number] DOB: 1986-09-04  12/12/2019  [name] was observed post Covid-19 immunization for 15 minutes without incident. She was provided with Vaccine Information Sheet and instruction to access the V-Safe system.   [name] was instructed to call 911 with any severe reactions post vaccine: Marland Kitchen Difficulty breathing  . Swelling of face and throat  . A fast heartbeat  . A bad rash all over body  . Dizziness and weakness   Immunizations Administered    Name Date Dose VIS Date Route   Moderna COVID-19 Vaccine 12/12/2019 12:59 PM 0.5 mL 08/18/2019 Intramuscular   Manufacturer: Levan Hurst   Lot: [id number]   [place]: [id number]

## 2020-01-12 ENCOUNTER — Other Ambulatory Visit: Payer: Self-pay | Admitting: Family Medicine

## 2020-01-12 DIAGNOSIS — F988 Other specified behavioral and emotional disorders with onset usually occurring in childhood and adolescence: Secondary | ICD-10-CM

## 2020-01-12 NOTE — Telephone Encounter (Signed)
Copied from [place] [id number]. Topic: Quick Communication - Rx Refill/Question >> Jan 12, 2020  4:18 PM Rainey Pines A wrote: Medication: amphetamine-dextroamphetamine (ADDERALL) 10 MG tablet   Has the patient contacted their pharmacy? Yes (Agent: If no, request that the patient contact the pharmacy for the refill.) (Agent: If yes, when and what did the pharmacy advise?)Contact PCP  Preferred Pharmacy (with phone number or street name): [facility] #[id number] [address], [place]  Phone:  [phone] Fax:  [phone]     Agent: Please be advised that RX refills may take up to 3 business days. We ask that you follow-up with your pharmacy.

## 2020-01-12 NOTE — Telephone Encounter (Signed)
Requested medication (s) are due for refill today:yes  Requested medication (s) are on the active medication list: yes  Last refill:  12/10/19  Future visit scheduled: No  Notes to clinic:  Not delegated    Requested Prescriptions  Pending Prescriptions Disp Refills   amphetamine-dextroamphetamine (ADDERALL) 10 MG tablet 60 tablet 0    Sig: Take 1 tablet (10 mg total) by mouth 2 (two) times daily.      Not Delegated - Psychiatry:  Stimulants/ADHD Failed - 01/12/2020  4:39 PM      Failed - This refill cannot be delegated      Failed - Urine Drug Screen completed in last 360 days.      Failed - Valid encounter within last 3 months    Recent Outpatient Visits           4 months ago Attention deficit disorder (ADD) without hyperactivity   [facility] Birdie Sons, MD   6 months ago Attention deficit disorder (ADD) without hyperactivity   [facility] Birdie Sons, MD   8 months ago Panic disorder   [facility] Birdie Sons, MD   2 years ago Panic disorder   [facility] Birdie Sons, MD   3 years ago Other acute pancreatitis   [facility] Birdie Sons, MD

## 2020-01-13 MED ORDER — AMPHETAMINE-DEXTROAMPHETAMINE 10 MG PO TABS: 10.0000 mg | ORAL_TABLET | Freq: Two times a day (BID) | ORAL | 0 refills | Status: DC

## 2020-02-08 ENCOUNTER — Other Ambulatory Visit: Payer: Self-pay | Admitting: Family Medicine

## 2020-02-08 DIAGNOSIS — F988 Other specified behavioral and emotional disorders with onset usually occurring in childhood and adolescence: Secondary | ICD-10-CM

## 2020-02-08 MED ORDER — AMPHETAMINE-DEXTROAMPHETAMINE 10 MG PO TABS: 10.0000 mg | ORAL_TABLET | Freq: Two times a day (BID) | ORAL | 0 refills | Status: DC

## 2020-02-08 NOTE — Telephone Encounter (Signed)
RX REFILL amphetamine-dextroamphetamine (ADDERALL) 10 MG tablet  PHARMACY [facility] #[id number] [address], [address] AT [place] Phone:  [phone]  Fax:  [phone]

## 2020-02-08 NOTE — Telephone Encounter (Signed)
LOV 09/07/2019 LRF 01/13/2020

## 2020-03-15 ENCOUNTER — Other Ambulatory Visit: Payer: Self-pay | Admitting: Family Medicine

## 2020-03-15 DIAGNOSIS — F988 Other specified behavioral and emotional disorders with onset usually occurring in childhood and adolescence: Secondary | ICD-10-CM

## 2020-03-15 MED ORDER — AMPHETAMINE-DEXTROAMPHETAMINE 10 MG PO TABS: 10.0000 mg | ORAL_TABLET | Freq: Two times a day (BID) | ORAL | 0 refills | Status: DC

## 2020-03-15 NOTE — Telephone Encounter (Signed)
RX REFILL amphetamine-dextroamphetamine (ADDERALL) 10 MG tablet [[id number]]    [id number] [place]  [phone] Fax:  [phone]

## 2020-04-13 ENCOUNTER — Ambulatory Visit: Payer: Self-pay | Admitting: Family Medicine

## 2020-04-13 NOTE — Progress Notes (Deleted)
     Established patient visit   Patient: Jo Perkins   DOB: 09-15-1986   34 y.o. Female  MRN: [id number] Visit Date: 04/13/2020  Today's healthcare provider: Lelon Huh, MD   No chief complaint on file.  Subjective    Neck Pain     ***  {Show patient history (optional):23778::" "}   Medications: Outpatient Medications Prior to Visit  Medication Sig  . amphetamine-dextroamphetamine (ADDERALL) 10 MG tablet Take 1 tablet (10 mg total) by mouth 2 (two) times daily.  . clonazePAM (KLONOPIN) 0.5 MG tablet TAKE 1 TABLET BY MOUTH IN THE MORNING AND 1/2 TAB ONCE TO TWICE A DAY AS NEEDED  . HUMIRA PEN-CROHNS STARTER 40 MG/0.8ML PNKT   . sertraline (ZOLOFT) 100 MG tablet Take 1-2 tablets (100-200 mg total) by mouth daily. (Patient taking differently: Take 100 mg by mouth daily. )   No facility-administered medications prior to visit.    Review of Systems  Musculoskeletal: Positive for neck pain.    {Heme  Chem  Endocrine  Serology  Results Review (optional):23779::" "}  Objective    There were no vitals taken for this visit. {Show previous vital signs (optional):23777::" "}  Physical Exam  ***  No results found for any visits on 04/13/20.  Assessment & Plan     ***  No follow-ups on file.      {provider attestation***:1}   Lelon Huh, MD  [facility] [phone] (phone) [phone] (fax)  [place]

## 2020-04-22 ENCOUNTER — Other Ambulatory Visit: Payer: Self-pay | Admitting: Family Medicine

## 2020-04-22 NOTE — Telephone Encounter (Signed)
Requested  medications are  due for refill today yes  Requested medications are on the active medication list yes  Last refill 7/1  Last visit Nov 2020  Future visit scheduled NO  Notes to clinic Not Delegated

## 2020-05-02 ENCOUNTER — Other Ambulatory Visit: Payer: Self-pay | Admitting: Family Medicine

## 2020-05-02 DIAGNOSIS — F988 Other specified behavioral and emotional disorders with onset usually occurring in childhood and adolescence: Secondary | ICD-10-CM

## 2020-05-02 NOTE — Telephone Encounter (Signed)
Requested medication (s) are due for refill today -yes  Requested medication (s) are on the active medication list -yes  Future visit scheduled -no  Last refill: 03/15/20  Notes to clinic: request for non delegated Rx  Requested Prescriptions  Pending Prescriptions Disp Refills   amphetamine-dextroamphetamine (ADDERALL) 10 MG tablet 60 tablet 0    Sig: Take 1 tablet (10 mg total) by mouth 2 (two) times daily.      Not Delegated - Psychiatry:  Stimulants/ADHD Failed - 05/02/2020  2:21 PM      Failed - This refill cannot be delegated      Failed - Urine Drug Screen completed in last 360 days.      Failed - Valid encounter within last 3 months    Recent Outpatient Visits           7 months ago Attention deficit disorder (ADD) without hyperactivity   [facility] Birdie Sons, MD   9 months ago Attention deficit disorder (ADD) without hyperactivity   [facility] Birdie Sons, MD   1 year ago Panic disorder   [facility] Birdie Sons, MD   2 years ago Panic disorder   [facility] Birdie Sons, MD   4 years ago Other acute pancreatitis   [place], MD                  Requested Prescriptions  Pending Prescriptions Disp Refills   amphetamine-dextroamphetamine (ADDERALL) 10 MG tablet 60 tablet 0    Sig: Take 1 tablet (10 mg total) by mouth 2 (two) times daily.      Not Delegated - Psychiatry:  Stimulants/ADHD Failed - 05/02/2020  2:21 PM      Failed - This refill cannot be delegated      Failed - Urine Drug Screen completed in last 360 days.      Failed - Valid encounter within last 3 months    Recent Outpatient Visits           7 months ago Attention deficit disorder (ADD) without hyperactivity   [facility] Birdie Sons, MD   9 months ago Attention deficit disorder (ADD) without hyperactivity   [facility] Birdie Sons, MD   1 year ago Panic disorder   [facility] Birdie Sons, MD   2 years ago Panic disorder   [facility] Birdie Sons, MD   4 years ago Other acute pancreatitis   [facility] Birdie Sons, MD

## 2020-05-02 NOTE — Telephone Encounter (Signed)
Copied from [place] [id number]. Topic: Quick Communication - Rx Refill/Question >> May 02, 2020  2:16 PM Leward Quan A wrote: Medication: amphetamine-dextroamphetamine (ADDERALL) 10 MG tablet  Has the patient contacted their pharmacy? No. (Agent: If no, request that the patient contact the pharmacy for the refill.) (Agent: If yes, when and what did the pharmacy advise?)  Preferred Pharmacy (with phone number or street name): [facility] [place], [place] - [place]  Phone:  [phone] Fax:  [phone]     Agent: Please be advised that RX refills may take up to 3 business days. We ask that you follow-up with your pharmacy.

## 2020-05-03 NOTE — Telephone Encounter (Signed)
LOV 09/07/19

## 2020-05-05 MED ORDER — AMPHETAMINE-DEXTROAMPHETAMINE 10 MG PO TABS: 10.0000 mg | ORAL_TABLET | Freq: Two times a day (BID) | ORAL | 0 refills | Status: DC

## 2020-05-05 NOTE — Telephone Encounter (Signed)
Needs f/u OV before future refills given

## 2020-06-17 ENCOUNTER — Other Ambulatory Visit: Payer: Self-pay | Admitting: Family Medicine

## 2020-06-17 DIAGNOSIS — F988 Other specified behavioral and emotional disorders with onset usually occurring in childhood and adolescence: Secondary | ICD-10-CM

## 2020-06-17 MED ORDER — AMPHETAMINE-DEXTROAMPHETAMINE 10 MG PO TABS: 10.0000 mg | ORAL_TABLET | Freq: Two times a day (BID) | ORAL | 0 refills | Status: DC

## 2020-06-17 NOTE — Telephone Encounter (Signed)
Medication Refill - Medication: Adderall  Has the patient contacted their pharmacy? No. (Agent: If no, request that the patient contact the pharmacy for the refill.) (Agent: If yes, when and what did the pharmacy advise?)  Preferred Pharmacy (with phone number or street name): [facility] #[id number] - [place], [place]: Please be advised that RX refills may take up to 3 business days. We ask that you follow-up with your pharmacy.

## 2020-06-17 NOTE — Telephone Encounter (Signed)
Requested medication (s) are due for refill today - yes  Requested medication (s) are on the active medication list -yes  Future visit scheduled -no  Last refill: 05/05/20  Notes to clinic: Request for non delegated Rx  Requested Prescriptions  Pending Prescriptions Disp Refills   amphetamine-dextroamphetamine (ADDERALL) 10 MG tablet 60 tablet 0    Sig: Take 1 tablet (10 mg total) by mouth 2 (two) times daily.      Not Delegated - Psychiatry:  Stimulants/ADHD Failed - 06/17/2020  4:01 PM      Failed - This refill cannot be delegated      Failed - Urine Drug Screen completed in last 360 days.      Failed - Valid encounter within last 3 months    Recent Outpatient Visits           9 months ago Attention deficit disorder (ADD) without hyperactivity   [facility] Birdie Sons, MD   11 months ago Attention deficit disorder (ADD) without hyperactivity   [facility] Birdie Sons, MD   1 year ago Panic disorder   [facility] Birdie Sons, MD   2 years ago Panic disorder   [facility] Birdie Sons, MD   4 years ago Other acute pancreatitis   [place], MD                  Requested Prescriptions  Pending Prescriptions Disp Refills   amphetamine-dextroamphetamine (ADDERALL) 10 MG tablet 60 tablet 0    Sig: Take 1 tablet (10 mg total) by mouth 2 (two) times daily.      Not Delegated - Psychiatry:  Stimulants/ADHD Failed - 06/17/2020  4:01 PM      Failed - This refill cannot be delegated      Failed - Urine Drug Screen completed in last 360 days.      Failed - Valid encounter within last 3 months    Recent Outpatient Visits           9 months ago Attention deficit disorder (ADD) without hyperactivity   [facility] Birdie Sons, MD   11 months ago Attention deficit disorder (ADD) without hyperactivity   [facility] Birdie Sons, MD   1 year ago Panic disorder   [facility] Birdie Sons, MD   2 years ago Panic disorder   [facility] Birdie Sons, MD   4 years ago Other acute pancreatitis   [facility] Birdie Sons, MD

## 2020-07-11 ENCOUNTER — Other Ambulatory Visit: Payer: Self-pay | Admitting: Family Medicine

## 2020-07-11 DIAGNOSIS — F988 Other specified behavioral and emotional disorders with onset usually occurring in childhood and adolescence: Secondary | ICD-10-CM

## 2020-07-11 NOTE — Telephone Encounter (Signed)
Medication: amphetamine-dextroamphetamine (ADDERALL) 10 MG tablet [[id number]]   Has the patient contacted their pharmacy? YES (Agent: If no, request that the patient contact the pharmacy for the refill.) (Agent: If yes, when and what did the pharmacy advise?)  Preferred Pharmacy (with phone number or street name): [facility] #[id number] [address], [place]  [place], [place] [address]  Phone:  [phone] Fax:  [phone]   Agent: Please be advised that RX refills may take up to 3 business days. We ask that you follow-up with your pharmacy.

## 2020-07-11 NOTE — Telephone Encounter (Signed)
Requested medication (s) are due for refill today: Yes  Requested medication (s) are on the active medication list: Yes  Last refill:  3 weeks ago  Future visit scheduled: No  Notes to clinic:  Unable to refill per protocol, cannot delegate, appointment needed     Requested Prescriptions  Pending Prescriptions Disp Refills   amphetamine-dextroamphetamine (ADDERALL) 10 MG tablet 60 tablet 0    Sig: Take 1 tablet (10 mg total) by mouth 2 (two) times daily.      Not Delegated - Psychiatry:  Stimulants/ADHD Failed - 07/11/2020  5:41 PM      Failed - This refill cannot be delegated      Failed - Urine Drug Screen completed in last 360 days.      Failed - Valid encounter within last 3 months    Recent Outpatient Visits           10 months ago Attention deficit disorder (ADD) without hyperactivity   [facility] Birdie Sons, MD   12 months ago Attention deficit disorder (ADD) without hyperactivity   [facility] Birdie Sons, MD   1 year ago Panic disorder   [facility] Birdie Sons, MD   2 years ago Panic disorder   [facility] Birdie Sons, MD   4 years ago Other acute pancreatitis   [facility] Birdie Sons, MD

## 2020-07-18 ENCOUNTER — Other Ambulatory Visit: Payer: Self-pay | Admitting: Family Medicine

## 2020-07-18 DIAGNOSIS — F410 Panic disorder [episodic paroxysmal anxiety] without agoraphobia: Secondary | ICD-10-CM

## 2020-07-18 NOTE — Telephone Encounter (Signed)
Need to contact patient to schedule a appointment

## 2020-07-19 ENCOUNTER — Other Ambulatory Visit: Payer: Self-pay | Admitting: Family Medicine

## 2020-07-19 DIAGNOSIS — F988 Other specified behavioral and emotional disorders with onset usually occurring in childhood and adolescence: Secondary | ICD-10-CM

## 2020-07-19 MED ORDER — AMPHETAMINE-DEXTROAMPHETAMINE 10 MG PO TABS: 10.0000 mg | ORAL_TABLET | Freq: Two times a day (BID) | ORAL | 0 refills | Status: DC

## 2020-07-29 ENCOUNTER — Ambulatory Visit: Payer: Self-pay | Admitting: Family Medicine

## 2020-07-29 ENCOUNTER — Telehealth: Payer: Self-pay

## 2020-07-29 NOTE — Telephone Encounter (Signed)
FYI. Thanks.

## 2020-07-29 NOTE — Telephone Encounter (Signed)
Copied from [place] [id number]. Topic: General - Other >> Jul 29, 2020  8:59 AM Keene Breath wrote: Reason for CRM: Patient called to cancel her appt. Today because she said there was a work emergency.  She did schedule another appt. Later in the month.

## 2020-08-08 ENCOUNTER — Encounter: Payer: Self-pay | Admitting: Family Medicine

## 2020-08-08 ENCOUNTER — Other Ambulatory Visit: Payer: Self-pay

## 2020-08-08 ENCOUNTER — Ambulatory Visit: Payer: BC Managed Care – PPO | Admitting: Family Medicine

## 2020-08-08 VITALS — BP 111/73 | HR 53 | Temp 98.6°F | Resp 16 | Ht 68.0 in | Wt 207.0 lb

## 2020-08-08 DIAGNOSIS — F410 Panic disorder [episodic paroxysmal anxiety] without agoraphobia: Secondary | ICD-10-CM

## 2020-08-08 DIAGNOSIS — Z23 Encounter for immunization: Secondary | ICD-10-CM | POA: Diagnosis not present

## 2020-08-08 DIAGNOSIS — F988 Other specified behavioral and emotional disorders with onset usually occurring in childhood and adolescence: Secondary | ICD-10-CM | POA: Diagnosis not present

## 2020-08-08 NOTE — Progress Notes (Signed)
Established patient visit   Patient: Jo Perkins   DOB: September 07, 1986   34 y.o. Female  MRN: [id number] Visit Date: 08/08/2020  Today's healthcare provider: Lelon Huh, MD   Chief Complaint  Patient presents with  . ADD  . Anxiety   Subjective    HPI    Follow up for Attention deficit disorder (ADD) without hyperactivity      From 09/07/2019-Doing very well on 26m Adderall Bid. Continue current medications.    She reports good compliance with treatment. She feels that condition is Unchanged. She is not having side effects. Patient reports that she has not used Adderall in the last month because she and her husband are trying to conceive.  They have been trying to conceive for several months and she has been stopping the Adderall during her ovulatory period. She does have more trouble focusing when she is off medication, but is manageable.    Follow up for Panic disorder:     From 09/07/2019-Sx completely controlled on 1026ma day of sertraline.   She states she stopped taking sertraline about 2 months ago and that her anxiety has not been a problem at all. She also has prescription for clonazepam which she rarely takes.       Medications: Outpatient Medications Prior to Visit  Medication Sig  . amphetamine-dextroamphetamine (ADDERALL) 10 MG tablet Take 1 tablet (10 mg total) by mouth 2 (two) times daily.  . clonazePAM (KLONOPIN) 0.5 MG tablet TAKE 1 TABLET BY MOUTH IN THE MORNING AND 1/2 TAB ONCE TO TWICE A DAY AS NEEDED  . HUMIRA PEN-CROHNS STARTER 40 MG/0.8ML PNKT   . sertraline (ZOLOFT) 100 MG tablet Take 1-2 tablets (100-200 mg total) by mouth daily. (Patient taking differently: Take 100 mg by mouth daily. )   No facility-administered medications prior to visit.    Review of Systems  Constitutional: Negative.   Respiratory: Negative.   Cardiovascular: Negative.   Endocrine: Negative.   Neurological: Negative.       Objective    BP 111/73   Pulse  (!) 53   Temp 98.6 F (37 C)   Resp 16   Ht 5' 8"  (1.727 m)   Wt 207 lb (93.9 kg)   BMI 31.47 kg/m    Physical Exam  General appearance: Obese female, cooperative and in no acute distress Head: Normocephalic, without obvious abnormality, atraumatic Respiratory: Respirations even and unlabored, normal respiratory rate Extremities: All extremities are intact.  Skin: Skin color, texture, turgor normal. No rashes seen  Psych: Appropriate mood and affect. Neurologic: Mental status: Alert, oriented to person, place, and time, thought content appropriate.   No results found for any visits on 08/08/20.  Assessment & Plan     1. Attention deficit disorder (ADD) without hyperactivity Discussed potential risk of Adderall when taken during pregnancy, particularly increased risk of spontaneous AB and premature deliveries. Recommend she stop or limit use as much as possible for best chance of having term pregnancy.   2. Panic disorder Was much much worse when she was having job related anxiety during panic, but has not been off sertraline for the last month and doing well. Although sertraline is not known to cause fetal harm, it cannot be ruled out recommend she stay off of sertraline so long as anxiety is not debilitating.   She is planning on seeing Ob in next month or so  3. Need for influenza vaccination  - Flu Vaccine QUAD 6+ mos PF IM (  Fluarix Quad PF)            The entirety of the information documented in the History of Present Illness, Review of Systems and Physical Exam were personally obtained by me. Portions of this information were initially documented by the CMA and reviewed by me for thoroughness and accuracy.      Lelon Huh, MD  [facility] [phone] (phone) [phone] (fax)  [place]

## 2020-09-02 ENCOUNTER — Other Ambulatory Visit: Payer: Self-pay | Admitting: Family Medicine

## 2020-09-02 NOTE — Telephone Encounter (Signed)
Requested medication (s) are due for refill today -yes  Requested medication (s) are on the active medication list -yes  Future visit scheduled -no  Last refill: 07/18/20  Notes to clinic: Request for non delegated rx  Requested Prescriptions  Pending Prescriptions Disp Refills   clonazePAM (KLONOPIN) 0.5 MG tablet [Pharmacy Med Name: CLONAZEPAM 0.5 MG TABLET] 30 tablet     Sig: TAKE 1 TABLET BY MOUTH IN THE MORNING AND 1/2 TAB ONCE TO TWICE A DAY AS NEEDED      Not Delegated - Psychiatry:  Anxiolytics/Hypnotics Failed - 09/02/2020  3:51 PM      Failed - This refill cannot be delegated      Failed - Urine Drug Screen completed in last 360 days      Passed - Valid encounter within last 6 months    Recent Outpatient Visits           3 weeks ago Attention deficit disorder (ADD) without hyperactivity   [facility] Malva Limes, MD   12 months ago Attention deficit disorder (ADD) without hyperactivity   [facility] Malva Limes, MD   1 year ago Attention deficit disorder (ADD) without hyperactivity   [facility] Malva Limes, MD   1 year ago Panic disorder   [facility] Malva Limes, MD   3 years ago Panic disorder   [facility], Demetrios Isaacs, MD                    Requested Prescriptions  Pending Prescriptions Disp Refills   clonazePAM (KLONOPIN) 0.5 MG tablet [Pharmacy Med Name: CLONAZEPAM 0.5 MG TABLET] 30 tablet     Sig: TAKE 1 TABLET BY MOUTH IN THE MORNING AND 1/2 TAB ONCE TO TWICE A DAY AS NEEDED      Not Delegated - Psychiatry:  Anxiolytics/Hypnotics Failed - 09/02/2020  3:51 PM      Failed - This refill cannot be delegated      Failed - Urine Drug Screen completed in last 360 days      Passed - Valid encounter within last 6 months    Recent Outpatient Visits           3 weeks ago Attention deficit disorder (ADD) without hyperactivity   [facility] Malva Limes, MD   12 months ago Attention deficit disorder (ADD) without hyperactivity   [facility] Malva Limes, MD   1 year ago Attention deficit disorder (ADD) without hyperactivity   [facility] Malva Limes, MD   1 year ago Panic disorder   [facility] Malva Limes, MD   3 years ago Panic disorder   [facility] Sherrie Mustache, Demetrios Isaacs, MD

## 2020-09-06 ENCOUNTER — Other Ambulatory Visit: Payer: Self-pay | Admitting: Family Medicine

## 2020-09-06 DIAGNOSIS — F988 Other specified behavioral and emotional disorders with onset usually occurring in childhood and adolescence: Secondary | ICD-10-CM

## 2020-09-06 MED ORDER — AMPHETAMINE-DEXTROAMPHETAMINE 10 MG PO TABS: 10.0000 mg | ORAL_TABLET | Freq: Two times a day (BID) | ORAL | 0 refills | Status: DC

## 2020-09-06 NOTE — Telephone Encounter (Signed)
Requested medication (s) are due for refill today:yes  Requested medication (s) are on the active medication list: yes  Last refill:  07/19/2020  Future visit scheduled: yes  Notes to clinic: this refill cannot be delegated    Requested Prescriptions  Pending Prescriptions Disp Refills   amphetamine-dextroamphetamine (ADDERALL) 10 MG tablet 60 tablet 0    Sig: Take 1 tablet (10 mg total) by mouth 2 (two) times daily.      There is no refill protocol information for this order

## 2020-09-06 NOTE — Telephone Encounter (Signed)
Medication Refill - Medication: amphetamine-dextroamphetamine (ADDERALL) 10 MG tablet     Preferred Pharmacy (with phone number or street name):  [facility] #[id number] [address], [place] Phone:  [phone]  Fax:  [phone]       Agent: Please be advised that RX refills may take up to 3 business days. We ask that you follow-up with your pharmacy.

## 2020-10-12 ENCOUNTER — Other Ambulatory Visit: Payer: Self-pay | Admitting: Family Medicine

## 2020-10-12 DIAGNOSIS — F988 Other specified behavioral and emotional disorders with onset usually occurring in childhood and adolescence: Secondary | ICD-10-CM

## 2020-10-12 NOTE — Telephone Encounter (Signed)
Copied from [place] [id number]. Topic: Quick Communication - Rx Refill/Question >> Oct 12, 2020  4:33 PM Tessa Lerner A wrote: Medication: amphetamine-dextroamphetamine (ADDERALL) 10 MG  (patient has 6(six) pills left)  Has the patient contacted their pharmacy? Patient was directed by pharmacy to contact PCP  Preferred Pharmacy (with phone number or street name): [facility] #[id number] - [place], [place] Phone: [phone]  Agent: Please be advised that RX refills may take up to 3 business days. We ask that you follow-up with your pharmacy.

## 2020-10-13 MED ORDER — AMPHETAMINE-DEXTROAMPHETAMINE 10 MG PO TABS: 10.0000 mg | ORAL_TABLET | Freq: Two times a day (BID) | ORAL | 0 refills | Status: DC

## 2020-10-13 NOTE — Telephone Encounter (Signed)
Non delegated refill for Adderall.

## 2020-10-31 ENCOUNTER — Other Ambulatory Visit: Payer: Self-pay

## 2020-10-31 ENCOUNTER — Ambulatory Visit: Payer: BC Managed Care – PPO | Admitting: Podiatry

## 2020-10-31 ENCOUNTER — Encounter: Payer: Self-pay | Admitting: Podiatry

## 2020-10-31 DIAGNOSIS — L603 Nail dystrophy: Secondary | ICD-10-CM | POA: Diagnosis not present

## 2020-10-31 NOTE — Progress Notes (Signed)
  Subjective:  Patient ID: Jo Perkins, female    DOB: 1986/07/06,  MRN: [id number] HPI Chief Complaint  Patient presents with  . Nail Problem    1st, 4th, 5th toenails left - thick, discolored x 6 weeks, initially had problem with athletes feet - Dermatologist Rx'd cream and oral med and that has improved  . New Patient (Initial Visit)    35 y.o. female presents with the above complaint.   ROS: Denies fever chills nausea vomiting muscle aches pains calf pain back pain chest pain shortness of breath.  Past Medical History:  Diagnosis Date  . Depression   . History of colon polyps   . Panic attack   . Ulcerative colitis [facility])    Past Surgical History:  Procedure Laterality Date  . none      Current Outpatient Medications:  .  HUMIRA PEN-CROHNS STARTER 40 MG/0.8ML PNKT, , Disp: , Rfl:   Allergies  Allergen Reactions  . Imuran  [Azathioprine]     caused pancreatitis   Review of Systems Objective:  There were no vitals filed for this visit.  General: Well developed, nourished, in no acute distress, alert and oriented x3   Dermatological: Skin is warm, dry and supple bilateral. Nails x 10 are well maintained; remaining integument appears unremarkable at this time. There are no open sores, no preulcerative lesions, no rash or signs of infection present.  Hallux nail left does demonstrate onychodystrophy possible onychomycosis.  Jo Perkins also has tinea pedis to the plantar aspect of the entire foot and interdigitally left only.  Vascular: Dorsalis Pedis artery and Posterior Tibial artery pedal pulses are 2/4 bilateral with immedate capillary fill time. Pedal hair growth present. No varicosities and no lower extremity edema present bilateral.   Neruologic: Grossly intact via light touch bilateral. Vibratory intact via tuning fork bilateral. Protective threshold with Semmes Wienstein monofilament intact to all pedal sites bilateral. Patellar and Achilles deep tendon reflexes 2+  bilateral. No Babinski or clonus noted bilateral.   Musculoskeletal: No gross boney pedal deformities bilateral. No pain, crepitus, or limitation noted with foot and ankle range of motion bilateral. Muscular strength 5/5 in all groups tested bilateral.  Gait: Unassisted, Nonantalgic.    Radiographs:  None taken  Assessment & Plan:   Assessment: Nail dystrophy and tinea pedis left.  Plan: Samples of the skin and nail were taken today to be sent for pathologic evaluation I will follow-up with [facility] in 1 month.     Kanani Mowbray T. [place], [place]

## 2020-11-07 ENCOUNTER — Telehealth: Payer: Self-pay | Admitting: Family Medicine

## 2020-11-07 DIAGNOSIS — F988 Other specified behavioral and emotional disorders with onset usually occurring in childhood and adolescence: Secondary | ICD-10-CM

## 2020-11-07 NOTE — Telephone Encounter (Signed)
Medication requested is not on current medication list. Please advise

## 2020-11-07 NOTE — Telephone Encounter (Signed)
Medication: amphetamine-dextroamphetamine (ADDERALL) 10 MG tablet [[id number]]  DISCONTINUED  Has the patient contacted their pharmacy? YES (Agent: If no, request that the patient contact the pharmacy for the refill.) (Agent: If yes, when and what did the pharmacy advise?)  Preferred Pharmacy (with phone number or street name): [facility] [place], [place] - [address] [place] [address] Phone: [phone] Fax: [phone] Hours: Not open 24 hours    Agent: Please be advised that RX refills may take up to 3 business days. We ask that you follow-up with your pharmacy.

## 2020-11-08 MED ORDER — AMPHETAMINE-DEXTROAMPHETAMINE 10 MG PO TABS: 10.0000 mg | ORAL_TABLET | Freq: Two times a day (BID) | ORAL | 0 refills | Status: DC

## 2020-11-22 ENCOUNTER — Encounter: Payer: Self-pay | Admitting: *Deleted

## 2020-11-28 ENCOUNTER — Ambulatory Visit (INDEPENDENT_AMBULATORY_CARE_PROVIDER_SITE_OTHER): Payer: BC Managed Care – PPO | Admitting: Podiatry

## 2020-11-28 ENCOUNTER — Encounter: Payer: Self-pay | Admitting: Podiatry

## 2020-11-28 ENCOUNTER — Other Ambulatory Visit: Payer: Self-pay

## 2020-11-28 DIAGNOSIS — Z79899 Other long term (current) drug therapy: Secondary | ICD-10-CM | POA: Diagnosis not present

## 2020-11-28 DIAGNOSIS — L603 Nail dystrophy: Secondary | ICD-10-CM | POA: Diagnosis not present

## 2020-11-28 MED ORDER — TERBINAFINE HCL 250 MG PO TABS: 250.0000 mg | ORAL_TABLET | Freq: Every day | ORAL | 0 refills | Status: DC

## 2020-11-28 NOTE — Patient Instructions (Signed)
Terbinafine tablets What is this medicine? TERBINAFINE (TER bin a feen) is an antifungal medicine. It is used to treat certain kinds of fungal or yeast infections. This medicine may be used for other purposes; ask your health care provider or pharmacist if you have questions. COMMON BRAND NAME(S): Lamisil, Terbinex What should I tell my health care provider before I take this medicine? They need to know if you have any of these conditions:  drink alcoholic beverages  kidney disease  liver disease  an unusual or allergic reaction to terbinafine, other medicines, foods, dyes, or preservatives  pregnant or trying to get pregnant  breast-feeding How should I use this medicine? Take this medicine by mouth with a full glass of water. Follow the directions on the prescription label. You can take this medicine with food or on an empty stomach. Take your medicine at regular intervals. Do not take your medicine more often than directed. Do not skip doses or stop your medicine early even if you feel better. Do not stop taking except on your doctor's advice. A special MedGuide will be given to you by the pharmacist with each prescription and refill. Be sure to read this information carefully each time. Talk to your pediatrician regarding the use of this medicine in children. Special care may be needed. Overdosage: If you think you have taken too much of this medicine contact a poison control center or emergency room at once. NOTE: This medicine is only for you. Do not share this medicine with others. What if I miss a dose? If you miss a dose, take it as soon as you can. If it is almost time for your next dose, take only that dose. Do not take double or extra doses. What may interact with this medicine? Do not take this medicine with any of the following medications:  thioridazine This medicine may also interact with the following  medications:  beta-blockers  caffeine  cimetidine  cyclosporine  medicines for depression, anxiety, or psychotic disturbances  medicines for fungal infections like fluconazole and ketoconazole  medicines for irregular heartbeat like amiodarone, flecainide and propafenone  rifampin  warfarin This list may not describe all possible interactions. Give your health care provider a list of all the medicines, herbs, non-prescription drugs, or dietary supplements you use. Also tell them if you smoke, drink alcohol, or use illegal drugs. Some items may interact with your medicine. What should I watch for while using this medicine? Visit your doctor or health care provider regularly. Tell your doctor right away if you have nausea or vomiting, loss of appetite, stomach pain on your right upper side, yellow skin, dark urine, light stools, or are over tired. Some fungal infections need many weeks or months of treatment to cure. If you are taking this medicine for a long time, you will need to have important blood work done. This medicine may cause serious skin reactions. They can happen weeks to months after starting the medicine. Contact your health care provider right away if you notice fevers or flu-like symptoms with a rash. The rash may be red or purple and then turn into blisters or peeling of the skin. Or, you might notice a red rash with swelling of the face, lips or lymph nodes in your neck or under your arms. What side effects may I notice from receiving this medicine? Side effects that you should report to your doctor or health care professional as soon as possible:  allergic reactions like skin rash or hives,  swelling of the face, lips, or tongue  changes in vision  dark urine  fever or infection  general ill feeling or flu-like symptoms  light-colored stools  loss of appetite, nausea  rash, fever, and swollen lymph nodes  redness, blistering, peeling or loosening of the  skin, including inside the mouth  right upper belly pain  unusually weak or tired  yellowing of the eyes or skin Side effects that usually do not require medical attention (report to your doctor or health care professional if they continue or are bothersome):  changes in taste  diarrhea  hair loss  muscle or joint pain  stomach gas  stomach upset This list may not describe all possible side effects. Call your doctor for medical advice about side effects. You may report side effects to FDA at 1-800-FDA-1088. Where should I keep my medicine? Keep out of the reach of children. Store at room temperature below 25 degrees C (77 degrees F). Protect from light. Throw away any unused medicine after the expiration date. NOTE: This sheet is a summary. It may not cover all possible information. If you have questions about this medicine, talk to your doctor, pharmacist, or health care provider.  2021 Elsevier/Gold Standard (2018-12-12 15:37:07)

## 2020-11-28 NOTE — Progress Notes (Signed)
She presents today for a follow-up of her pathology result.  She goes on to say that her toenail has come off.  She also says that she is concerned about the thickening of the fifth toe left foot nail.  Objective: Vital signs stable alert oriented x3 pathology is positive for Trichophyton rubrum.  Assessment: Onychomycosis.  Plan: At this point she has already tried topicals to no avail laser therapy will work on the skin plus the nail fell off so we are going to try oral therapy.  We did go over the pros and cons of the oral therapy she understands the risks involved.  We also wrote a prescription for CMP for blood draw to evaluate liver and kidney function.  We will go ahead and start her on 30 tablets of Lamisil I will follow-up with her in 1 month.  Should she have questions or concerns she will notify me me immediately.

## 2020-12-02 LAB — COMPREHENSIVE METABOLIC PANEL
ALT: 21 IU/L (ref 0–32)
AST: 21 IU/L (ref 0–40)
Albumin/Globulin Ratio: 1.5 (ref 1.2–2.2)
Albumin: 4.9 g/dL — ABNORMAL HIGH (ref 3.8–4.8)
Alkaline Phosphatase: 63 IU/L (ref 44–121)
BUN/Creatinine Ratio: 14 (ref 9–23)
BUN: 11 mg/dL (ref 6–20)
Bilirubin Total: 0.5 mg/dL (ref 0.0–1.2)
CO2: 20 mmol/L (ref 20–29)
Calcium: 9.8 mg/dL (ref 8.7–10.2)
Chloride: 99 mmol/L (ref 96–106)
Creatinine, Ser: 0.79 mg/dL (ref 0.57–1.00)
Globulin, Total: 3.2 g/dL (ref 1.5–4.5)
Glucose: 74 mg/dL (ref 65–99)
Potassium: 4.6 mmol/L (ref 3.5–5.2)
Sodium: 135 mmol/L (ref 134–144)
Total Protein: 8.1 g/dL (ref 6.0–8.5)
eGFR: 101 mL/min/{1.73_m2} (ref >59)

## 2020-12-13 ENCOUNTER — Other Ambulatory Visit: Payer: Self-pay | Admitting: Family Medicine

## 2020-12-13 DIAGNOSIS — F988 Other specified behavioral and emotional disorders with onset usually occurring in childhood and adolescence: Secondary | ICD-10-CM

## 2020-12-13 NOTE — Telephone Encounter (Signed)
Medication Refill - Medication: amphetamine-dextroamphetamine (ADDERALL) 10 MG tablet   Has the patient contacted their pharmacy? No. (Agent: If no, request that the patient contact the pharmacy for the refill.) (Agent: If yes, when and what did the pharmacy advise?)  Preferred Pharmacy (with phone number or street name): [facility] [place], [place] - [address]  [place], [place] [address]  Phone:  [phone] Fax:  [phone]   Agent: Please be advised that RX refills may take up to 3 business days. We ask that you follow-up with your pharmacy.

## 2020-12-13 NOTE — Telephone Encounter (Signed)
Requested medication (s) are due for refill today: yes  Requested medication (s) are on the active medication list: yes  Last refill:  11/08/20 #60 0 refills  Future visit scheduled: yes in one week   Notes to clinic:  not delegated per protocol     Requested Prescriptions  Pending Prescriptions Disp Refills   amphetamine-dextroamphetamine (ADDERALL) 10 MG tablet 60 tablet 0    Sig: Take 1 tablet (10 mg total) by mouth 2 (two) times daily.      Not Delegated - Psychiatry:  Stimulants/ADHD Failed - 12/13/2020  4:11 PM      Failed - This refill cannot be delegated      Failed - Urine Drug Screen completed in last 360 days      Failed - Valid encounter within last 3 months    Recent Outpatient Visits           4 months ago Attention deficit disorder (ADD) without hyperactivity   [facility] Birdie Sons, MD   1 year ago Attention deficit disorder (ADD) without hyperactivity   [facility] Birdie Sons, MD   1 year ago Attention deficit disorder (ADD) without hyperactivity   [facility] Birdie Sons, MD   1 year ago Panic disorder   [facility] Birdie Sons, MD   3 years ago Panic disorder   [place], Kirstie Peri, MD       Future Appointments             In 1 week Fisher, Kirstie Peri, MD [facility], [place]

## 2020-12-14 MED ORDER — AMPHETAMINE-DEXTROAMPHETAMINE 10 MG PO TABS: 10.0000 mg | ORAL_TABLET | Freq: Two times a day (BID) | ORAL | 0 refills | Status: DC

## 2020-12-23 ENCOUNTER — Encounter: Payer: Self-pay | Admitting: Family Medicine

## 2020-12-23 ENCOUNTER — Ambulatory Visit: Payer: BC Managed Care – PPO | Admitting: Family Medicine

## 2020-12-23 ENCOUNTER — Other Ambulatory Visit: Payer: Self-pay

## 2020-12-23 VITALS — BP 113/86 | HR 73 | Temp 98.8°F | Wt 205.6 lb

## 2020-12-23 DIAGNOSIS — F410 Panic disorder [episodic paroxysmal anxiety] without agoraphobia: Secondary | ICD-10-CM

## 2020-12-23 DIAGNOSIS — F988 Other specified behavioral and emotional disorders with onset usually occurring in childhood and adolescence: Secondary | ICD-10-CM

## 2020-12-23 NOTE — Progress Notes (Signed)
Established patient visit   Patient: Jo Perkins   DOB: [birth year]   35 y.o. Female  MRN: [id number] Visit Date: 12/23/2020  Today's healthcare provider: Lelon Huh, MD   Chief Complaint  Patient presents with  . ADHD  I,Porsha C McClurkin,acting as a scribe for Lelon Huh, MD.,have documented all relevant documentation on the behalf of Lelon Huh, MD,as directed by  Lelon Huh, MD while in the presence of Lelon Huh, MD.  Subjective    HPI  Follow up for Attention deficit disorder (ADD) without hyperactivity:  The patient was last seen for this 4 months ago. Changes made at last visit include recommend she stop or limit use as much as possible for best chance of having term pregnancy.  She reports good compliance with treatment. She feels that condition is Unchanged. She is not having side effects.  She stops Adderall when she is ovulating and when she became pregnant earlier this year. However, she still had SAB in March. She has starting back on Adderall since SAB but plans on put on hold when ovulating and if she gets pregnant. She states that her Ob is aware of this regiment.  -----------------------------------------------------------------------------------------  Follow up for Panic disorder:  The patient was last seen for this 4 months ago. Changes made at last visit include recommend she stay off of sertraline during pregnancy so long as anxiety is not debilitating.   She reports good compliance with treatment. She feels that condition is Improved. She is not having side effects.   She does take occasional clonazepam, but also puts on hold when ovulating and when she became pregnant.  -----------------------------------------------------------------------------------------      Medications: Outpatient Medications Prior to Visit  Medication Sig  . amphetamine-dextroamphetamine (ADDERALL) 10 MG tablet Take 1 tablet (10 mg total) by mouth 2  (two) times daily.  Marland Kitchen HUMIRA PEN-CROHNS STARTER 40 MG/0.8ML PNKT   . terbinafine (LAMISIL) 250 MG tablet Take 1 tablet (250 mg total) by mouth daily.   No facility-administered medications prior to visit.        Objective    BP 113/86 (BP Location: Left Arm, Patient Position: Sitting, Cuff Size: Normal)   Pulse 73   Temp 98.8 F (37.1 C) (Oral)   Wt 205 lb 9.6 oz (93.3 kg)   SpO2 99%   BMI 31.26 kg/m     Physical Exam    General: Appearance:    Mildly obese female in no acute distress  Eyes:    PERRL, conjunctiva/corneas clear, EOM's intact       Lungs:     Clear to auscultation bilaterally, respirations unlabored  Heart:    Normal heart rate. Normal rhythm. No murmurs, rubs, or gallops.   MS:   All extremities are intact.   Neurologic:   Awake, alert, oriented x 3. No apparent focal neurological           defect.          Assessment & Plan     1. Attention deficit disorder (ADD) without hyperactivity Doing well with Adderall, aware of potential effects on pregnancy. She does not taking during periods of her cycle that she is ovulating and will stop if pregnancy ensues.   2. Panic disorder Doing for off of sertraline, only using occasional clonazepam.         The entirety of the information documented in the History of Present Illness, Review of Systems and Physical Exam were personally obtained by me.  Portions of this information were initially documented by the CMA and reviewed by me for thoroughness and accuracy.      Lelon Huh, MD  [facility] [phone] (phone) [phone] (fax)  [place]

## 2021-01-04 ENCOUNTER — Encounter: Payer: Self-pay | Admitting: Podiatry

## 2021-01-04 ENCOUNTER — Ambulatory Visit (INDEPENDENT_AMBULATORY_CARE_PROVIDER_SITE_OTHER): Payer: BC Managed Care – PPO | Admitting: Podiatry

## 2021-01-04 DIAGNOSIS — L603 Nail dystrophy: Secondary | ICD-10-CM

## 2021-01-04 DIAGNOSIS — Z79899 Other long term (current) drug therapy: Secondary | ICD-10-CM | POA: Diagnosis not present

## 2021-01-04 MED ORDER — TERBINAFINE HCL 250 MG PO TABS: 250.0000 mg | ORAL_TABLET | Freq: Every day | ORAL | 0 refills | Status: DC

## 2021-01-04 NOTE — Progress Notes (Signed)
She presents today for follow-up of her nail fungus she is complete the first 30 days of Lamisil and denies any problems taking the medication.  She states that she is no change as of yet.  Objective: Vital signs are stable alert oriented x3.  No change in nail evaluation yet.  Assessment: Onychomycosis long-term therapy with Lamisil.  Plan: We are requesting blood work today consisting of a CMP should this come back abnormal I will notify her immediately.  Otherwise I will go ahead and write a prescription for the next 90 days of Lamisil should she have questions or should notify us immediately.  Otherwise I will see her in 4 months

## 2021-01-07 DIAGNOSIS — Z79899 Other long term (current) drug therapy: Secondary | ICD-10-CM | POA: Insufficient documentation

## 2021-01-09 ENCOUNTER — Other Ambulatory Visit: Payer: Self-pay | Admitting: Family Medicine

## 2021-01-09 DIAGNOSIS — F988 Other specified behavioral and emotional disorders with onset usually occurring in childhood and adolescence: Secondary | ICD-10-CM

## 2021-01-09 NOTE — Telephone Encounter (Signed)
Requested medication (s) are due for refill today: yes  Requested medication (s) are on the active medication list: yes  Last refill:  12/14/20 #60 0 refills  Future visit scheduled: no   Notes to clinic:  not delegated per protocol     Requested Prescriptions  Pending Prescriptions Disp Refills   amphetamine-dextroamphetamine (ADDERALL) 10 MG tablet 60 tablet 0    Sig: Take 1 tablet (10 mg total) by mouth 2 (two) times daily.      Not Delegated - Psychiatry:  Stimulants/ADHD Failed - 01/09/2021  3:49 PM      Failed - This refill cannot be delegated      Failed - Urine Drug Screen completed in last 360 days      Passed - Valid encounter within last 3 months    Recent Outpatient Visits           2 weeks ago Attention deficit disorder (ADD) without hyperactivity   [facility] Birdie Sons, MD   5 months ago Attention deficit disorder (ADD) without hyperactivity   [facility] Birdie Sons, MD   1 year ago Attention deficit disorder (ADD) without hyperactivity   [facility] Birdie Sons, MD   1 year ago Attention deficit disorder (ADD) without hyperactivity   [facility] Birdie Sons, MD   1 year ago Panic disorder   [place] [place], Kirstie Peri, MD

## 2021-01-09 NOTE — Telephone Encounter (Signed)
Medication Refill - Medication: amphetamine-dextroamphetamine (ADDERALL) 10 MG tablet   Pt will be out in two days   Has the patient contacted their pharmacy? No. (Agent: If no, request that the patient contact the pharmacy for the refill.) (Agent: If yes, when and what did the pharmacy advise?)  Preferred Pharmacy (with phone number or street name):  [facility] [place], [place] - [address]  [place] [address]  Phone: [phone] Fax: [phone]     Agent: Please be advised that RX refills may take up to 3 business days. We ask that you follow-up with your pharmacy.

## 2021-01-10 MED ORDER — AMPHETAMINE-DEXTROAMPHETAMINE 10 MG PO TABS
10.0000 mg | ORAL_TABLET | Freq: Two times a day (BID) | ORAL | 0 refills | Status: DC
Start: 2021-01-10 — End: 2021-03-17

## 2021-01-16 ENCOUNTER — Other Ambulatory Visit: Payer: Self-pay | Admitting: Family Medicine

## 2021-01-19 LAB — COMPREHENSIVE METABOLIC PANEL
ALT: 10 IU/L (ref 0–32)
AST: 20 IU/L (ref 0–40)
Albumin/Globulin Ratio: 1.5 (ref 1.2–2.2)
Albumin: 4.6 g/dL (ref 3.8–4.8)
Alkaline Phosphatase: 53 IU/L (ref 44–121)
BUN/Creatinine Ratio: 16 (ref 9–23)
BUN: 15 mg/dL (ref 6–20)
Bilirubin Total: 0.2 mg/dL (ref 0.0–1.2)
CO2: 20 mmol/L (ref 20–29)
Calcium: 9.6 mg/dL (ref 8.7–10.2)
Chloride: 100 mmol/L (ref 96–106)
Creatinine, Ser: 0.91 mg/dL (ref 0.57–1.00)
Globulin, Total: 3.1 g/dL (ref 1.5–4.5)
Glucose: 89 mg/dL (ref 65–99)
Potassium: 4.3 mmol/L (ref 3.5–5.2)
Sodium: 136 mmol/L (ref 134–144)
Total Protein: 7.7 g/dL (ref 6.0–8.5)
eGFR: 85 mL/min/{1.73_m2} (ref >59)

## 2021-01-20 ENCOUNTER — Telehealth: Payer: Self-pay

## 2021-01-20 NOTE — Telephone Encounter (Signed)
-----   Message from Garrel Ridgel, [place] sent at 01/19/2021  4:50 PM EDT ----- Blood work looks good and may continue medication.

## 2021-01-20 NOTE — Telephone Encounter (Signed)
Patient notified of lab results and instructed to continue medication per Dr. Stephenie Acres instructions

## 2021-01-23 ENCOUNTER — Encounter: Payer: Self-pay | Admitting: Family Medicine

## 2021-03-17 ENCOUNTER — Other Ambulatory Visit: Payer: Self-pay | Admitting: Family Medicine

## 2021-03-17 DIAGNOSIS — F988 Other specified behavioral and emotional disorders with onset usually occurring in childhood and adolescence: Secondary | ICD-10-CM

## 2021-03-17 MED ORDER — AMPHETAMINE-DEXTROAMPHETAMINE 10 MG PO TABS: 10.0000 mg | ORAL_TABLET | Freq: Two times a day (BID) | ORAL | 0 refills | Status: DC

## 2021-03-17 NOTE — Telephone Encounter (Signed)
Medication Refill - Medication: Adderlall  Has the patient contacted their pharmacy? Yes.   Pt states that she always has to call this in to office. Please advise.  (Agent: If no, request that the patient contact the pharmacy for the refill.) (Agent: If yes, when and what did the pharmacy advise?)  Preferred Pharmacy (with phone number or street name):  [facility] [place], [place] - [address]  [place] [address]  Phone: [phone] Fax: [phone]  Hours: Not open 24 hours    Agent: Please be advised that RX refills may take up to 3 business days. We ask that you follow-up with your pharmacy.

## 2021-03-17 NOTE — Telephone Encounter (Signed)
Requested medication (s) are due for refill today: yes   Requested medication (s) are on the active medication list: yes   Last refill:  01/09/2021  Future visit scheduled: no  Notes to clinic:  this refill cannot be delegated   Requested Prescriptions  Pending Prescriptions Disp Refills   amphetamine-dextroamphetamine (ADDERALL) 10 MG tablet 60 tablet 0    Sig: Take 1 tablet (10 mg total) by mouth 2 (two) times daily.      Not Delegated - Psychiatry:  Stimulants/ADHD Failed - 03/17/2021 10:51 AM      Failed - This refill cannot be delegated      Failed - Urine Drug Screen completed in last 360 days      Passed - Valid encounter within last 3 months    Recent Outpatient Visits           2 months ago Attention deficit disorder (ADD) without hyperactivity   [facility] Birdie Sons, MD   7 months ago Attention deficit disorder (ADD) without hyperactivity   [facility] Birdie Sons, MD   1 year ago Attention deficit disorder (ADD) without hyperactivity   [facility] Birdie Sons, MD   1 year ago Attention deficit disorder (ADD) without hyperactivity   [facility] Birdie Sons, MD   1 year ago Panic disorder   [place], Kirstie Peri, MD

## 2021-05-10 ENCOUNTER — Other Ambulatory Visit: Payer: Self-pay | Admitting: Family Medicine

## 2021-05-10 ENCOUNTER — Encounter: Payer: BC Managed Care – PPO | Admitting: Podiatry

## 2021-05-10 NOTE — Telephone Encounter (Signed)
Requested medication (s) are due for refill today - yes  Requested medication (s) are on the active medication list -yes  Future visit scheduled -no  Last refill: 01/16/21 #30 3RF  Notes to clinic: Request RF: non delegated Rx  Requested Prescriptions  Pending Prescriptions Disp Refills   clonazePAM (KLONOPIN) 0.5 MG tablet [Pharmacy Med Name: CLONAZEPAM 0.5 MG TABLET] 30 tablet 3    Sig: TAKE 1 TABLET BY MOUTH IN THE MORNING AND 1/2 TAB ONCE TO TWICE A DAY AS NEEDED     Not Delegated - Psychiatry:  Anxiolytics/Hypnotics Failed - 05/10/2021  9:21 AM      Failed - This refill cannot be delegated      Failed - Urine Drug Screen completed in last 360 days      Passed - Valid encounter within last 6 months    Recent Outpatient Visits           4 months ago Attention deficit disorder (ADD) without hyperactivity   [facility] Birdie Sons, MD   9 months ago Attention deficit disorder (ADD) without hyperactivity   [facility] Birdie Sons, MD   1 year ago Attention deficit disorder (ADD) without hyperactivity   [facility] Birdie Sons, MD   1 year ago Attention deficit disorder (ADD) without hyperactivity   [facility] Birdie Sons, MD   2 years ago Panic disorder   [place], Kirstie Peri, MD                 Requested Prescriptions  Pending Prescriptions Disp Refills   clonazePAM (KLONOPIN) 0.5 MG tablet [Pharmacy Med Name: CLONAZEPAM 0.5 MG TABLET] 30 tablet 3    Sig: TAKE 1 TABLET BY MOUTH IN THE MORNING AND 1/2 TAB ONCE TO TWICE A DAY AS NEEDED     Not Delegated - Psychiatry:  Anxiolytics/Hypnotics Failed - 05/10/2021  9:21 AM      Failed - This refill cannot be delegated      Failed - Urine Drug Screen completed in last 360 days      Passed - Valid encounter within last 6 months    Recent Outpatient Visits           4 months ago Attention deficit disorder (ADD)  without hyperactivity   [facility] Birdie Sons, MD   9 months ago Attention deficit disorder (ADD) without hyperactivity   [facility] Birdie Sons, MD   1 year ago Attention deficit disorder (ADD) without hyperactivity   [facility] Birdie Sons, MD   1 year ago Attention deficit disorder (ADD) without hyperactivity   [facility] Birdie Sons, MD   2 years ago Panic disorder   [place], Kirstie Peri, MD

## 2021-06-05 LAB — HM PAP SMEAR: HM Pap smear: NEGATIVE

## 2021-06-05 LAB — RESULTS CONSOLE HPV: CHL HPV: NEGATIVE

## 2021-07-05 NOTE — H&P (Signed)
Jo Perkins is a 35 y.o. female here for D & C consult . Pt is here for follow up for missed ab . Seen yesterday  15+5 weeks , No FHR seen on u/s  ultrasoundographer verified as well no FHR  Fetus contracted on u/s , no official measurements done   maternal BT : A+ Past Medical History:  has a past medical history of Depression with anxiety, Pancreatitis, recurrent (2010, 2017 x2), SAB (spontaneous abortion), and Ulcerative colitis, chronic (CMS-HCC).  Past Surgical History:  has a past surgical history that includes Colonoscopy. Family History: family history includes Arthritis in her mother; Atrial fibrillation (Abnormal heart rhythm sometimes requiring treatment with blood thinners) in her mother; No Known Problems in her father and sister; Sleep apnea in her mother. Social History:  reports that she has never smoked. She has never used smokeless tobacco. She reports previous alcohol use. She reports that she does not use drugs. OB/GYN History:          OB History     Gravida  2   Para  0   Term  0   Preterm  0   AB  1   Living  0      SAB  1   IAB  0   Ectopic  0   Molar  0   Multiple  0   Live Births  0             Allergies: is allergic to imuran [azathioprine sodium]. Medications:   Current Outpatient Medications:    sertraline (ZOLOFT) 100 MG tablet, Take 1 tablet (100 mg total) by mouth once daily, Disp: 90 tablet, Rfl: 3   clonazePAM (KLONOPIN) 0.5 MG tablet, Take by mouth as needed (Patient not taking: No sig reported), Disp: , Rfl:    dextroamphetamine-amphetamine (ADDERALL) 10 mg tablet, Take by mouth (Patient not taking: No sig reported), Disp: , Rfl:    HUMIRA,CF, PEN 40 mg/0.4 mL pen injector kit, INJECT 1 PEN UNDER THE SKIN EVERY 7 DAYS. (Patient not taking: No sig reported), Disp: 6 kit, Rfl: 1   HYDROcodone-acetaminophen (NORCO) 5-325 mg tablet, Take 1 tablet by mouth every 8 (eight) hours as needed for Pain POST OP 1-2 TABLETS EVERY 4 -6 HOURS  PRN, Disp: 5 tablet, Rfl: 0   prenatal vitamin with iron-folic acid (PRENATAL TABLETS) tablet, Take 1 tablet by mouth once daily (Patient not taking: Reported on 07/05/2021), Disp: , Rfl:    terbinafine HCL (LAMISIL) 250 mg tablet, , Disp: , Rfl:    Review of Systems: General:                      No fatigue or weight loss Eyes:                           No vision changes Ears:                            No hearing difficulty Respiratory:                No cough or shortness of breath Pulmonary:                  No asthma or shortness of breath Cardiovascular:           No chest pain, palpitations, dyspnea on exertion Gastrointestinal:  No abdominal bloating, chronic diarrhea, constipations, masses, pain or hematochezia Genitourinary:             No hematuria, dysuria, abnormal vaginal discharge, pelvic pain, Menometrorrhagia Lymphatic:                   No swollen lymph nodes Musculoskeletal:         No muscle weakness Neurologic:                  No extremity weakness, syncope, seizure disorder Psychiatric:                  No history of depression, delusions or suicidal/homicidal ideation      Exam:       Vitals:    07/05/21 1048  BP: 116/80  Pulse: 68      Body mass index is 30.85 kg/m.   WDWN white/  female in NAD   Lungs: CTA  CV : RRR without murmur   Breast: exam done in sitting and lying position : No dimpling or retraction, no dominant mass, no spontaneous discharge, no axillary adenopathy Neck:  no thyromegaly Abdomen: soft , no mass, normal active bowel sounds,  non-tender, no rebound tenderness Pelvic: tanner stage 5 ,  External genitalia: vulva /labia no lesions Urethra: no prolapse Vagina: normal physiologic d/c, no blood  Cervix: no lesions, no cervical motion tenderness   Uterus: 14 weeks in size  Adnexa: no mass,  non-tender   Rectovaginal:    Impression:    The encounter diagnosis was Missed abortion.       Plan:    Spoke to her about  options and we have decided to perform a Suction D+C. Benefits and risks to surgery: The proposed benefit of the surgery has been discussed with the patient. The possible risks include, but are not limited to: organ injury to the bowel , bladder, ureters, and major blood vessels and nerves. There is a possibility of additional surgeries resulting from these injuries. There is also the risk of blood transfusion and the need to receive blood products during or after the procedure which may rarely lead to HIV or Hepatitis C infection. There is a risk of developing a deep venous thrombosis or a pulmonary embolism . There is the possibility of wound infection and also anesthetic complications, even the rare possibility of death. The patient understands these risks and wishes to proceed. All questions have been answered and the consent has been signed.

## 2021-07-06 ENCOUNTER — Encounter
Admission: RE | Admit: 2021-07-06 | Discharge: 2021-07-06 | Disposition: A | Discharge status: Home / Self Care | Payer: BC Managed Care – PPO | Source: Ambulatory Visit | Attending: Obstetrics and Gynecology | Admitting: Obstetrics and Gynecology

## 2021-07-06 ENCOUNTER — Other Ambulatory Visit: Payer: Self-pay

## 2021-07-06 ENCOUNTER — Encounter: Payer: Self-pay | Admitting: *Deleted

## 2021-07-06 NOTE — Patient Instructions (Signed)
Your procedure is scheduled on:07-07-21  Report to the Registration Desk on the 1st floor of the [facility].Then proceed to the 2nd floor Surgery Desk in the [facility] at 6 AM  REMEMBER: Instructions that are not followed completely may result in serious medical risk, up to and including death; or upon the discretion of your surgeon and anesthesiologist your surgery may need to be rescheduled.  Do not eat food after midnight the night before surgery.  No gum chewing, lozengers or hard candies.  You may however, drink CLEAR liquids up to 2 hours before you are scheduled to arrive for your surgery. Do not drink anything within 2 hours of your scheduled arrival time.  Clear liquids include: - water  - apple juice without pulp - gatorade (not RED, PURPLE, OR BLUE) - [place]on coffee or tea (Do NOT add milk or creamers to the coffee or tea) Do NOT drink anything that is not on this list.  TAKE THESE MEDICATIONS THE MORNING OF SURGERY WITH A SIP OF WATER: -You may take clonazePAM (KLONOPIN) 0.5 MG tablet the morning of surgery if needed for anxiety  One week prior to surgery: Stop Anti-inflammatories (NSAIDS) such as Advil, Aleve, Ibuprofen, Motrin, Naproxen, Naprosyn and Aspirin based products such as Excedrin, Goodys Powder, BC Powder. You may however, take Tylenol if needed for pain up until the day of surgery.  No Alcohol for 24 hours before or after surgery.  No Smoking including e-cigarettes for 24 hours prior to surgery.  No chewable tobacco products for at least 6 hours prior to surgery.  No nicotine patches on the day of surgery.  Do not use any "recreational" drugs for at least a week prior to your surgery.  Please be advised that the combination of cocaine and anesthesia may have negative outcomes, up to and including death. If you test positive for cocaine, your surgery will be cancelled.  On the morning of surgery brush your teeth with toothpaste and water, you may  rinse your mouth with mouthwash if you wish. Do not swallow any toothpaste or mouthwash.  Do not wear jewelry, make-up, hairpins, clips or nail polish.  Do not wear lotions, powders, or perfumes.   Do not shave body from the neck down 48 hours prior to surgery just in case you cut yourself which could leave a site for infection.  Also, freshly shaved skin may become irritated if using the CHG soap.  Contact lenses, hearing aids and dentures may not be worn into surgery.  Do not bring valuables to the hospital. [facility] is not responsible for any missing/lost belongings or valuables.   Notify your doctor if there is any change in your medical condition (cold, fever, infection).  Wear comfortable clothing (specific to your surgery type) to the hospital.  After surgery, you can help prevent lung complications by doing breathing exercises.  Take deep breaths and cough every 1-2 hours. Your doctor may order a device called an Incentive Spirometer to help you take deep breaths. When coughing or sneezing, hold a pillow firmly against your incision with both hands. This is called "splinting." Doing this helps protect your incision. It also decreases belly discomfort.  If you are being admitted to the hospital overnight, leave your suitcase in the car. After surgery it may be brought to your room.  If you are being discharged the day of surgery, you will not be allowed to drive home. You will need a responsible adult (18 years or older) to drive  you home and stay with you that night.   If you are taking public transportation, you will need to have a responsible adult (18 years or older) with you. Please confirm with your physician that it is acceptable to use public transportation.   Please call the [facility] at [phone] if you have any questions about these instructions.  Surgery Visitation Policy:  Patients undergoing a surgery or procedure may have one family  member or support person with them as long as that person is not COVID-19 positive or experiencing its symptoms.  That person may remain in the waiting area during the procedure and may rotate out with other people.  Inpatient Visitation:    Visiting hours are 7 a.m. to 8 p.m. Up to two visitors ages 16+ are allowed at one time in a patient room. The visitors may rotate out with other people during the day. Visitors must check out when they leave, or other visitors will not be allowed. One designated support person may remain overnight. The visitor must pass COVID-19 screenings, use hand sanitizer when entering and exiting the patient's room and wear a mask at all times, including in the patient's room. Patients must also wear a mask when staff or their visitor are in the room. Masking is required regardless of vaccination status.

## 2021-07-07 ENCOUNTER — Ambulatory Visit: Payer: BC Managed Care – PPO | Admitting: Anesthesiology

## 2021-07-07 ENCOUNTER — Ambulatory Visit
Admission: RE | Admit: 2021-07-07 | Discharge: 2021-07-07 | Disposition: A | Discharge status: Home / Self Care | Payer: BC Managed Care – PPO | Attending: Obstetrics and Gynecology | Admitting: Obstetrics and Gynecology

## 2021-07-07 ENCOUNTER — Encounter
Admission: RE | Disposition: A | Discharge status: Home / Self Care | Payer: Self-pay | Source: Home / Self Care | Attending: Obstetrics and Gynecology

## 2021-07-07 DIAGNOSIS — Z79899 Other long term (current) drug therapy: Secondary | ICD-10-CM | POA: Insufficient documentation

## 2021-07-07 DIAGNOSIS — O021 Missed abortion: Secondary | ICD-10-CM | POA: Insufficient documentation

## 2021-07-07 DIAGNOSIS — O09292 Supervision of pregnancy with other poor reproductive or obstetric history, second trimester: Secondary | ICD-10-CM | POA: Diagnosis not present

## 2021-07-07 HISTORY — PX: DILATION AND CURETTAGE OF UTERUS: SHX78

## 2021-07-07 LAB — TYPE AND SCREEN
ABO/RH(D): A POS
Antibody Screen: NEGATIVE

## 2021-07-07 LAB — CBC
HCT: 35.8 % — ABNORMAL LOW (ref 36.0–46.0)
Hemoglobin: 12.8 g/dL (ref 12.0–15.0)
MCH: 31.1 pg (ref 26.0–34.0)
MCHC: 35.8 g/dL (ref 30.0–36.0)
MCV: 87.1 fL (ref 80.0–100.0)
Platelets: 223 10*3/uL (ref 150–400)
RBC: 4.11 MIL/uL (ref 3.87–5.11)
RDW: 12.3 % (ref 11.5–15.5)
WBC: 8.2 10*3/uL (ref 4.0–10.5)
nRBC: 0 % (ref 0.0–0.2)

## 2021-07-07 LAB — BASIC METABOLIC PANEL
Anion gap: 6 (ref 5–15)
BUN: 12 mg/dL (ref 6–20)
CO2: 24 mmol/L (ref 22–32)
Calcium: 9.1 mg/dL (ref 8.9–10.3)
Chloride: 104 mmol/L (ref 98–111)
Creatinine, Ser: 0.49 mg/dL (ref 0.44–1.00)
GFR, Estimated: >60 mL/min (ref >60)
Glucose, Bld: 94 mg/dL (ref 70–99)
Potassium: 4 mmol/L (ref 3.5–5.1)
Sodium: 134 mmol/L — ABNORMAL LOW (ref 135–145)

## 2021-07-07 LAB — ABO/RH: ABO/RH(D): A POS

## 2021-07-07 SURGERY — DILATION AND CURETTAGE
Anesthesia: General

## 2021-07-07 MED ORDER — FAMOTIDINE 20 MG PO TABS: 20.0000 mg | ORAL_TABLET | Freq: Once | ORAL | Status: AC

## 2021-07-07 MED ORDER — MIDAZOLAM HCL 2 MG/2ML IJ SOLN
INTRAMUSCULAR | Status: AC
  Filled 2021-07-07: qty 2

## 2021-07-07 MED ORDER — GABAPENTIN 300 MG PO CAPS: 300.0000 mg | ORAL_CAPSULE | ORAL | Status: AC

## 2021-07-07 MED ORDER — POVIDONE-IODINE 10 % EX SWAB: 2.0000 "application " | Freq: Once | CUTANEOUS | Status: DC

## 2021-07-07 MED ORDER — 0.9 % SODIUM CHLORIDE (POUR BTL) OPTIME
TOPICAL | Status: DC | PRN
  Administered 2021-07-07: 20 mL

## 2021-07-07 MED ORDER — DOXYCYCLINE HYCLATE 100 MG IV SOLR
200.0000 mg | INTRAVENOUS | Status: AC
  Administered 2021-07-07: 200 mg via INTRAVENOUS
  Filled 2021-07-07: qty 200

## 2021-07-07 MED ORDER — FENTANYL CITRATE (PF) 100 MCG/2ML IJ SOLN
25.0000 ug | INTRAMUSCULAR | Status: DC | PRN
  Administered 2021-07-07: 50 ug via INTRAVENOUS

## 2021-07-07 MED ORDER — ACETAMINOPHEN 500 MG PO TABS
ORAL_TABLET | ORAL | Status: AC
  Administered 2021-07-07: 1000 mg via ORAL
  Filled 2021-07-07: qty 2

## 2021-07-07 MED ORDER — ONDANSETRON HCL 4 MG/2ML IJ SOLN
INTRAMUSCULAR | Status: AC
  Filled 2021-07-07: qty 2

## 2021-07-07 MED ORDER — DEXAMETHASONE SODIUM PHOSPHATE 10 MG/ML IJ SOLN
INTRAMUSCULAR | Status: AC
  Filled 2021-07-07: qty 1

## 2021-07-07 MED ORDER — ONDANSETRON HCL 4 MG/2ML IJ SOLN: 4.0000 mg | Freq: Once | INTRAMUSCULAR | Status: DC | PRN

## 2021-07-07 MED ORDER — FENTANYL CITRATE (PF) 100 MCG/2ML IJ SOLN
INTRAMUSCULAR | Status: AC
  Filled 2021-07-07: qty 2

## 2021-07-07 MED ORDER — PROPOFOL 10 MG/ML IV BOLUS
INTRAVENOUS | Status: DC | PRN
Start: 2021-07-07 — End: 2021-07-07
  Administered 2021-07-07: 180 mg via INTRAVENOUS

## 2021-07-07 MED ORDER — PHENYLEPHRINE HCL (PRESSORS) 10 MG/ML IV SOLN
INTRAVENOUS | Status: AC
  Filled 2021-07-07: qty 1

## 2021-07-07 MED ORDER — LIDOCAINE HCL (CARDIAC) PF 100 MG/5ML IV SOSY
PREFILLED_SYRINGE | INTRAVENOUS | Status: DC | PRN
  Administered 2021-07-07: 100 mg via INTRAVENOUS

## 2021-07-07 MED ORDER — METHYLERGONOVINE MALEATE 0.2 MG/ML IJ SOLN
INTRAMUSCULAR | Status: AC
  Filled 2021-07-07: qty 1

## 2021-07-07 MED ORDER — METHYLERGONOVINE MALEATE 0.2 MG/ML IJ SOLN
INTRAMUSCULAR | Status: DC | PRN
  Administered 2021-07-07: .2 mg via INTRAMUSCULAR

## 2021-07-07 MED ORDER — PROPOFOL 10 MG/ML IV BOLUS
INTRAVENOUS | Status: AC
  Filled 2021-07-07: qty 20

## 2021-07-07 MED ORDER — FAMOTIDINE 20 MG PO TABS
ORAL_TABLET | ORAL | Status: AC
  Administered 2021-07-07: 20 mg via ORAL
  Filled 2021-07-07: qty 1

## 2021-07-07 MED ORDER — CHLORHEXIDINE GLUCONATE 0.12 % MT SOLN: 15.0000 mL | Freq: Once | OROMUCOSAL | Status: AC

## 2021-07-07 MED ORDER — LIDOCAINE HCL (PF) 2 % IJ SOLN
INTRAMUSCULAR | Status: AC
  Filled 2021-07-07: qty 5

## 2021-07-07 MED ORDER — FENTANYL CITRATE (PF) 100 MCG/2ML IJ SOLN
INTRAMUSCULAR | Status: DC | PRN
  Administered 2021-07-07 (×2): 25 ug via INTRAVENOUS

## 2021-07-07 MED ORDER — MEPERIDINE HCL 25 MG/ML IJ SOLN: 6.2500 mg | INTRAMUSCULAR | Status: DC | PRN

## 2021-07-07 MED ORDER — LACTATED RINGERS IV SOLN: INTRAVENOUS | Status: DC

## 2021-07-07 MED ORDER — FENTANYL CITRATE (PF) 100 MCG/2ML IJ SOLN
INTRAMUSCULAR | Status: AC
  Administered 2021-07-07: 25 ug via INTRAVENOUS
  Filled 2021-07-07: qty 2

## 2021-07-07 MED ORDER — CHLORHEXIDINE GLUCONATE 0.12 % MT SOLN
OROMUCOSAL | Status: AC
  Administered 2021-07-07: 15 mL via OROMUCOSAL
  Filled 2021-07-07: qty 15

## 2021-07-07 MED ORDER — MIDAZOLAM HCL 2 MG/2ML IJ SOLN
INTRAMUSCULAR | Status: DC | PRN
  Administered 2021-07-07: 2 mg via INTRAVENOUS

## 2021-07-07 MED ORDER — ONDANSETRON HCL 4 MG/2ML IJ SOLN
INTRAMUSCULAR | Status: DC | PRN
  Administered 2021-07-07: 4 mg via INTRAVENOUS

## 2021-07-07 MED ORDER — SODIUM CHLORIDE (PF) 0.9 % IJ SOLN
INTRAMUSCULAR | Status: AC
  Filled 2021-07-07: qty 20

## 2021-07-07 MED ORDER — ACETAMINOPHEN 500 MG PO TABS: 1000.0000 mg | ORAL_TABLET | ORAL | Status: AC

## 2021-07-07 MED ORDER — ORAL CARE MOUTH RINSE: 15.0000 mL | Freq: Once | OROMUCOSAL | Status: AC

## 2021-07-07 MED ORDER — GABAPENTIN 300 MG PO CAPS
ORAL_CAPSULE | ORAL | Status: AC
  Administered 2021-07-07: 300 mg via ORAL
  Filled 2021-07-07: qty 1

## 2021-07-07 SURGICAL SUPPLY — 26 items
FILTER UTR ASPR ASSEMBLY (MISCELLANEOUS) ×2 IMPLANT
FILTER UTR ASPR SPEC (MISCELLANEOUS) ×1 IMPLANT
FLTR UTR ASPR SPEC (MISCELLANEOUS) ×2
GAUZE 4X4 16PLY ~~LOC~~+RFID DBL (SPONGE) ×2 IMPLANT
GLOVE SURG SYN 8.0 (GLOVE) ×2 IMPLANT
GOWN STRL REUS W/ TWL LRG LVL3 (GOWN DISPOSABLE) ×1 IMPLANT
GOWN STRL REUS W/ TWL XL LVL3 (GOWN DISPOSABLE) ×1 IMPLANT
GOWN STRL REUS W/TWL LRG LVL3 (GOWN DISPOSABLE) ×2
GOWN STRL REUS W/TWL XL LVL3 (GOWN DISPOSABLE) ×2
KIT BERKELEY 1ST TRIMESTER 3/8 (MISCELLANEOUS) ×2 IMPLANT
KIT TURNOVER CYSTO (KITS) ×2 IMPLANT
MANIFOLD NEPTUNE II (INSTRUMENTS) ×2 IMPLANT
PACK DNC HYST (MISCELLANEOUS) ×2 IMPLANT
PAD OB MATERNITY 4.3X12.25 (PERSONAL CARE ITEMS) ×2 IMPLANT
PAD PREP 24X41 OB/GYN DISP (PERSONAL CARE ITEMS) ×2 IMPLANT
SCRUB EXIDINE 4% CHG 4OZ (MISCELLANEOUS) ×2 IMPLANT
SET BERKELEY SUCTION TUBING (SUCTIONS) ×2 IMPLANT
TOWEL OR 17X26 4PK STRL BLUE (TOWEL DISPOSABLE) ×2 IMPLANT
TRAP TISSUE FILTER (MISCELLANEOUS) ×2 IMPLANT
VACURETTE 10 RIGID CVD (CANNULA) ×2 IMPLANT
VACURETTE 6 ASPIR F TIP BERK (CANNULA) IMPLANT
VACURETTE 7MM F TIP (CANNULA)
VACURETTE 7MM F TIP STRL (CANNULA) IMPLANT
VACURETTE 8 RIGID CVD (CANNULA) IMPLANT
VACURETTE 8MM F TIP (MISCELLANEOUS) ×2 IMPLANT
WATER STERILE IRR 500ML POUR (IV SOLUTION) ×2 IMPLANT

## 2021-07-07 NOTE — Op Note (Signed)
NAMECHERICE, GLENNIE MEDICAL RECORD NO: [id number] ACCOUNT NO: [id number] DATE OF BIRTH: 10/30/1985 FACILITY: [facility] LOCATION: [facility] PHYSICIAN: Boykin Nearing, MD  Operative Report   DATE OF PROCEDURE: 07/07/2021   PREOPERATIVE DIAGNOSIS:  Missed abortion.  POSTOPERATIVE DIAGNOSIS:  Missed abortion.  PROCEDURE:  Suction dilation and curettage.  ANESTHESIA:  General endotracheal anesthesia.  SURGEON: Boykin Nearing, MD  INDICATIONS:  A 35 year old gravida 2, para 0, patient at 15 weeks by dates with ultrasound appearance of approximately 12 weeks with no fetal heart motion identified.  Blood type A positive.  DESCRIPTION OF PROCEDURE:  After adequate general endotracheal anesthesia, the patient was placed in dorsal supine position with legs in the candy cane stirrups.  The patient did receive 200 mg doxycycline for surgical prophylaxis.  Timeout was  performed.  Straight catheterization of the bladder yielded 100 mL clear urine.  Weighted speculum was placed in the posterior vaginal vault and the anterior cervix was grasped with a single tooth tenaculum.  Cervix was dilated to #20 Hanks dilator  without difficulty.  A #8 flexible and #10 rigid suction curette were used to evacuate the uterus.  Large amount of tissue fragmented, it was removed.  Open forceps were used to extract additional tissue.  A sharp curettage was performed and repeat  suction curetting with both the 8 flexible and 10 rigid were performed.  Good hemostasis was noted.  There were no complications.  ESTIMATED BLOOD LOSS:  500 mL  INTRAOPERATIVE FLUIDS:  400 mL.   The patient tolerated the procedure well and was taken to recovery room in good condition.   [email or web address] D: 07/07/2021 9:01:57 am T: 07/07/2021 10:01:00 am  JOB: [id number]/ [id number]

## 2021-07-07 NOTE — Brief Op Note (Signed)
07/07/2021  8:26 AM  PATIENT:  Jo Perkins  35 y.o. female  PRE-OPERATIVE DIAGNOSIS:  missed ab  POST-OPERATIVE DIAGNOSIS:  missed ab  PROCEDURE:  Procedure(s): SUCTION DILATATION AND CURETTAGE (N/A)  SURGEON:  Surgeon(s) and Role:    * Talbot Monarch, Gwen Her, MD - Primary  PHYSICIAN ASSISTANT: CST  ASSISTANTS: none   ANESTHESIA:   general  EBL:  500 mL IOF 400 cc UO 100cc  BLOOD ADMINISTERED:none  DRAINS: none   LOCAL MEDICATIONS USED:  NONE  SPECIMEN:  Source of Specimen:  POC  DISPOSITION OF SPECIMEN:  PATHOLOGY  COUNTS:  YES  TOURNIQUET:  * No tourniquets in log *  DICTATION: .Other Dictation: Dictation Number verbal   PLAN OF CARE: Discharge to home after PACU  PATIENT DISPOSITION:  PACU - hemodynamically stable.   Delay start of Pharmacological VTE agent (>24hrs) due to surgical blood loss or risk of bleeding: not applicable

## 2021-07-07 NOTE — Anesthesia Preprocedure Evaluation (Signed)
Anesthesia Evaluation  Patient identified by MRN, date of birth, ID band Patient awake    Reviewed: Allergy & Precautions, NPO status , Patient's Chart, lab work & pertinent test results  Airway Mallampati: II  TM Distance: >3 FB Neck ROM: Full    Dental no notable dental hx.    Pulmonary neg pulmonary ROS,    Pulmonary exam normal        Cardiovascular negative cardio ROS Normal cardiovascular exam     Neuro/Psych negative neurological ROS  negative psych ROS   GI/Hepatic Neg liver ROS, PUD,   Endo/Other  negative endocrine ROS  Renal/GU negative Renal ROS  negative genitourinary   Musculoskeletal negative musculoskeletal ROS (+)   Abdominal   Peds negative pediatric ROS (+)  Hematology negative hematology ROS (+) anemia ,   Anesthesia Other Findings Acute pancreatitis 05/05/2016  Depression    History of colon polyps   Pancreatitis, acute    Panic attack    Ulcerative colitis ([place])       Reproductive/Obstetrics negative OB ROS                            Anesthesia Physical Anesthesia Plan  ASA: 2  Anesthesia Plan: General   Post-op Pain Management:    Induction: Intravenous  PONV Risk Score and Plan: 3 and Propofol infusion, Ondansetron and Midazolam  Airway Management Planned: LMA  Additional Equipment:   Intra-op Plan:   Post-operative Plan: Extubation in OR  Informed Consent: I have reviewed the patients History and Physical, chart, labs and discussed the procedure including the risks, benefits and alternatives for the proposed anesthesia with the patient or authorized representative who has indicated his/her understanding and acceptance.       Plan Discussed with: CRNA, Anesthesiologist and Surgeon  Anesthesia Plan Comments:         Anesthesia Quick Evaluation

## 2021-07-07 NOTE — Anesthesia Procedure Notes (Signed)
Procedure Name: LMA Insertion Date/Time: 07/07/2021 7:44 AM Performed by: Fredderick Phenix, CRNA Pre-anesthesia Checklist: Patient identified, Emergency Drugs available, Suction available and Patient being monitored Patient Re-evaluated:Patient Re-evaluated prior to induction Oxygen Delivery Method: Circle system utilized Preoxygenation: Pre-oxygenation with 100% oxygen Induction Type: IV induction Ventilation: Mask ventilation without difficulty LMA: LMA inserted LMA Size: 4.0 Tube type: Oral Number of attempts: 1 Airway Equipment and Method: Stylet and Oral airway Placement Confirmation: positive ETCO2 and breath sounds checked- equal and bilateral Tube secured with: Tape Dental Injury: Teeth and Oropharynx as per pre-operative assessment

## 2021-07-07 NOTE — Progress Notes (Signed)
Pt is ready for suction D+C  All questions answered .  Proceed

## 2021-07-07 NOTE — Transfer of Care (Signed)
Immediate Anesthesia Transfer of Care Note  Patient: Garvin Fila Gathright  Procedure(s) Performed: SUCTION DILATATION AND CURETTAGE  Patient Location: PACU  Anesthesia Type:General  Level of Consciousness: awake and alert   Airway & Oxygen Therapy: Patient Spontanous Breathing and Patient connected to face mask oxygen  Post-op Assessment: Report given to RN and Post -op Vital signs reviewed and stable  Post vital signs: Reviewed and stable  Last Vitals:  Vitals Value Taken Time  BP 95/71 07/07/21 0834  Temp    Pulse 60 07/07/21 0836  Resp 8 07/07/21 0836  SpO2 99 % 07/07/21 0836  Vitals shown include unvalidated device data.  Last Pain:  Vitals:   07/07/21 0633  TempSrc: Tympanic  PainSc: 0-No pain         Complications: No notable events documented.

## 2021-07-07 NOTE — Discharge Instructions (Signed)

## 2021-07-07 NOTE — Anesthesia Postprocedure Evaluation (Signed)
Anesthesia Post Note  Patient: Jo Perkins  Procedure(s) Performed: SUCTION DILATATION AND CURETTAGE  Patient location during evaluation: PACU Anesthesia Type: General Level of consciousness: awake and alert, awake and oriented Pain management: pain level controlled Vital Signs Assessment: post-procedure vital signs reviewed and stable Respiratory status: spontaneous breathing, nonlabored ventilation and respiratory function stable Cardiovascular status: blood pressure returned to baseline and stable Postop Assessment: no apparent nausea or vomiting Anesthetic complications: no   No notable events documented.   Last Vitals:  Vitals:   07/07/21 0904 07/07/21 0924  BP: 119/85 107/77  Pulse: 71 74  Resp: 10 18  Temp: (!) 36.3 C   SpO2: 97% 98%    Last Pain:  Vitals:   07/07/21 0924  TempSrc:   PainSc: 3                  Phill Mutter

## 2021-07-08 ENCOUNTER — Other Ambulatory Visit: Payer: Self-pay | Admitting: Obstetrics

## 2021-07-08 DIAGNOSIS — Z9889 Other specified postprocedural states: Secondary | ICD-10-CM

## 2021-07-08 MED ORDER — HYDROCODONE-ACETAMINOPHEN 5-325 MG PO TABS: 1.0000 | ORAL_TABLET | Freq: Four times a day (QID) | ORAL | 0 refills | Status: DC | PRN

## 2021-07-08 MED ORDER — MISOPROSTOL 200 MCG PO TABS: 800.0000 ug | ORAL_TABLET | Freq: Once | ORAL | 0 refills | Status: DC

## 2021-07-08 NOTE — Addendum Note (Signed)
Addended by: Avelino Leeds on: 83/43/7357 01:58 PM   Modules accepted: Orders

## 2021-07-08 NOTE — Progress Notes (Deleted)
Patient called answering service reporting severe abdominal cramping and heavy vaginal bleeding. Denies N/V or elevated temperatures. Patient states she woke up around 0430 with her pad and underwear soaked. She has changed her pad an additional 3 times from 0800-1045 am, but admits that she is no longer bleeding as heavy and the pad is not soaked when she changed them. I notified MD and received new orders for patient to stop taking methergine and take 800 mcg of Cytotec vaginally x1 and will call in additional pain medication. Patient to notify office of excessive bleeding and/or s/s of infection  F. Bobette Mo CNM

## 2021-07-10 ENCOUNTER — Encounter: Payer: Self-pay | Admitting: Obstetrics and Gynecology

## 2021-07-10 LAB — SURGICAL PATHOLOGY

## 2021-07-17 ENCOUNTER — Other Ambulatory Visit: Payer: Self-pay | Admitting: Family Medicine

## 2021-07-17 DIAGNOSIS — F988 Other specified behavioral and emotional disorders with onset usually occurring in childhood and adolescence: Secondary | ICD-10-CM

## 2021-07-17 NOTE — Telephone Encounter (Signed)
Copied from [place] [phone]. Topic: Quick Communication - Rx Refill/Question >> Jul 17, 2021  3:07 PM Leward Quan A wrote: Medication: amphetamine-dextroamphetamine (ADDERALL) 10 MG tablet   Has the patient contacted their pharmacy? No. Always call office because of the type of medication  (Agent: If no, request that the patient contact the pharmacy for the refill. If patient does not wish to contact the pharmacy document the reason why and proceed with request.) (Agent: If yes, when and what did the pharmacy advise?)  Preferred Pharmacy (with phone number or street name): [id number] - [place], [place] [address]  Phone:  [phone] Fax:  [phone]    Has the patient been seen for an appointment in the last year OR does the patient have an upcoming appointment? Yes.    Agent: Please be advised that RX refills may take up to 3 business days. We ask that you follow-up with your pharmacy.

## 2021-07-17 NOTE — Telephone Encounter (Signed)
Requested medications are due for refill today.  unknown  Requested medications are on the active medications list.  no  Last refill. 03/17/2021  Future visit scheduled.   no  Notes to clinic.  Medication not delegated. Medication was d/c'd 07/07/2021 by Dr. Ouida Sills.

## 2021-07-18 MED ORDER — AMPHETAMINE-DEXTROAMPHETAMINE 10 MG PO TABS: 10.0000 mg | ORAL_TABLET | Freq: Two times a day (BID) | ORAL | 0 refills | Status: DC

## 2021-07-18 NOTE — Telephone Encounter (Signed)
LOV: 12/23/2020

## 2021-10-13 ENCOUNTER — Ambulatory Visit
Admission: RE | Admit: 2021-10-13 | Discharge: 2021-10-13 | Disposition: A | Discharge status: Home / Self Care | Payer: BC Managed Care – PPO | Source: Ambulatory Visit | Attending: Obstetrics and Gynecology | Admitting: Obstetrics and Gynecology

## 2021-10-13 ENCOUNTER — Other Ambulatory Visit: Payer: Self-pay | Admitting: Obstetrics and Gynecology

## 2021-10-13 ENCOUNTER — Other Ambulatory Visit: Payer: Self-pay

## 2021-10-13 DIAGNOSIS — O200 Threatened abortion: Secondary | ICD-10-CM | POA: Insufficient documentation

## 2021-10-31 ENCOUNTER — Other Ambulatory Visit: Payer: Self-pay

## 2021-10-31 ENCOUNTER — Ambulatory Visit
Admission: RE | Admit: 2021-10-31 | Discharge: 2021-10-31 | Disposition: A | Discharge status: Home / Self Care | Payer: BC Managed Care – PPO | Source: Ambulatory Visit | Attending: Surgery | Admitting: Surgery

## 2021-10-31 ENCOUNTER — Other Ambulatory Visit: Payer: Self-pay | Admitting: Surgery

## 2021-10-31 DIAGNOSIS — O3680X0 Pregnancy with inconclusive fetal viability, not applicable or unspecified: Secondary | ICD-10-CM

## 2021-11-26 ENCOUNTER — Other Ambulatory Visit: Payer: Self-pay | Admitting: Family Medicine

## 2021-11-30 ENCOUNTER — Other Ambulatory Visit: Payer: Self-pay | Admitting: Family Medicine

## 2021-11-30 NOTE — Telephone Encounter (Signed)
Medication Refill - Medication: amphetamine-dextroamphetamine (ADDERALL) 10 MG tablet ? ?Has the patient contacted their pharmacy? No. No, Refills. ?(Agent: If no, request that the patient contact the pharmacy for the refill. If patient does not wish to contact the pharmacy document the reason why and proceed with request.) ?(Agent: If yes, when and what did the pharmacy advise?) ? ?Preferred Pharmacy (with phone number or street name):  ?[id number] [place], [place] [address] [place]  ?[place] [address]  ?Phone: [phone] Fax: [phone]  ?Hours: Not open 24 hours  ? ?Has the patient been seen for an appointment in the last year OR does the patient have an upcoming appointment? Yes.   ? ?Agent: Please be advised that RX refills may take up to 3 business days. We ask that you follow-up with your pharmacy.  ?

## 2021-12-01 MED ORDER — AMPHETAMINE-DEXTROAMPHETAMINE 10 MG PO TABS: 10.0000 mg | ORAL_TABLET | Freq: Two times a day (BID) | ORAL | 0 refills | Status: DC

## 2021-12-01 NOTE — Telephone Encounter (Signed)
Requested medication (s) are due for refill today:   Provider to review ? ?Requested medication (s) are on the active medication list:   Yes ? ?Future visit scheduled:   No ? ? ?Last ordered: 07/18/2021 #60, 0 refills ? ?Returned because it's a non delegated refill  ? ?Requested Prescriptions  ?Pending Prescriptions Disp Refills  ? amphetamine-dextroamphetamine (ADDERALL) 10 MG tablet 60 tablet 0  ?  Sig: Take 1 tablet (10 mg total) by mouth 2 (two) times daily.  ?  ? Not Delegated - Psychiatry:  Stimulants/ADHD Failed - 11/30/2021  3:53 PM  ?  ?  Failed - This refill cannot be delegated  ?  ?  Failed - Urine Drug Screen completed in last 360 days  ?  ?  Failed - Valid encounter within last 6 months  ?  Recent Outpatient Visits   ? ?      ? 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 [facility] Caryn Section, Kirstie Peri, MD  ? 1 year ago Attention deficit disorder (ADD) without hyperactivity  ? [facility] Birdie Sons, MD  ? 2 years ago Attention deficit disorder (ADD) without hyperactivity  ? [facility] Birdie Sons, MD  ? 2 years ago Attention deficit disorder (ADD) without hyperactivity  ? [facility] Birdie Sons, MD  ? 2 years ago Panic disorder  ? [facility] Birdie Sons, MD  ? ?  ?  ? ?  ?  ?  Passed - Last BP in normal range  ?  BP Readings from Last 1 Encounters:  ?07/07/21 107/77  ?  ?  ?  ?  Passed - Last Heart Rate in normal range  ?  Pulse Readings from Last 1 Encounters:  ?07/07/21 74  ?  ?  ?  ?  ? ?

## 2022-01-01 ENCOUNTER — Other Ambulatory Visit: Payer: Self-pay | Admitting: Family Medicine

## 2022-01-01 DIAGNOSIS — F988 Other specified behavioral and emotional disorders with onset usually occurring in childhood and adolescence: Secondary | ICD-10-CM

## 2022-01-01 NOTE — Telephone Encounter (Signed)
Copied from [place] [phone]. Topic: General - Other ?>> Jan 01, 2022  2:49 PM Tessa Lerner A wrote: ?Reason for CRM: Medication Refill - Medication: amphetamine-dextroamphetamine (ADDERALL) 10 MG tablet [[id number]]  ? ?Has the patient contacted their pharmacy? No. ?(Agent: If no, request that the patient contact the pharmacy for the refill. If patient does not wish to contact the pharmacy document the reason why and proceed with request.) ?(Agent: If yes, when and what did the pharmacy advise?) ? ?Preferred Pharmacy (with phone number or street name): [facility] #[id number] - [place], [place] - [address] ?[place] [address] ?Phone: [phone] Fax: [phone] ?Hours: Not open 24 hours ? ?Has the patient been seen for an appointment in the last year OR does the patient have an upcoming appointment? Yes.   ? ?Agent: Please be advised that RX refills may take up to 3 business days. We ask that you follow-up with your pharmacy. ?

## 2022-01-02 MED ORDER — AMPHETAMINE-DEXTROAMPHETAMINE 10 MG PO TABS: 10.0000 mg | ORAL_TABLET | Freq: Two times a day (BID) | ORAL | 0 refills | Status: DC

## 2022-01-02 NOTE — Telephone Encounter (Signed)
Requested medications are due for refill today.  yes ? ?Requested medications are on the active medications list.  yes ? ?Last refill. 12/01/2021 #60 0 refills ? ?Future visit scheduled.   no ? ?Notes to clinic.  Medication refill is not delegated. ? ? ? ?Requested Prescriptions  ?Pending Prescriptions Disp Refills  ? amphetamine-dextroamphetamine (ADDERALL) 10 MG tablet 60 tablet 0  ?  Sig: Take 1 tablet (10 mg total) by mouth 2 (two) times daily.  ?  ? Not Delegated - Psychiatry:  Stimulants/ADHD Failed - 01/01/2022  3:13 PM  ?  ?  Failed - This refill cannot be delegated  ?  ?  Failed - Urine Drug Screen completed in last 360 days  ?  ?  Failed - Valid encounter within last 6 months  ?  Recent Outpatient Visits   ? ?      ? 1 year ago Attention deficit disorder (ADD) without hyperactivity  ? [facility] Caryn Section, Kirstie Peri, MD  ? 1 year ago Attention deficit disorder (ADD) without hyperactivity  ? [facility] Birdie Sons, MD  ? 2 years ago Attention deficit disorder (ADD) without hyperactivity  ? [facility] Birdie Sons, MD  ? 2 years ago Attention deficit disorder (ADD) without hyperactivity  ? [facility] Birdie Sons, MD  ? 2 years ago Panic disorder  ? [facility] Birdie Sons, MD  ? ?  ?  ? ? ?  ?  ?  Passed - Last BP in normal range  ?  BP Readings from Last 1 Encounters:  ?07/07/21 107/77  ?  ?  ?  ?  Passed - Last Heart Rate in normal range  ?  Pulse Readings from Last 1 Encounters:  ?07/07/21 74  ?  ?  ?  ?  ?  ?

## 2022-02-02 ENCOUNTER — Other Ambulatory Visit: Payer: Self-pay | Admitting: Family Medicine

## 2022-02-02 DIAGNOSIS — F988 Other specified behavioral and emotional disorders with onset usually occurring in childhood and adolescence: Secondary | ICD-10-CM

## 2022-02-02 NOTE — Telephone Encounter (Signed)
Copied from [place] [id number]. Topic: General - Other >> Feb 02, 2022  2:41 PM Tessa Lerner A wrote: Reason for CRM: Medication Refill - Medication: amphetamine-dextroamphetamine (ADDERALL) 10 MG tablet [[id number]]   Has the patient contacted their pharmacy? No. (Agent: If no, request that the patient contact the pharmacy for the refill. If patient does not wish to contact the pharmacy document the reason why and proceed with request.) (Agent: If yes, when and what did the pharmacy advise?)  Preferred Pharmacy (with phone number or street name): [facility] [place], [place] - [address] [place] [address] Phone: [phone] Fax: [phone] Hours: Not open 24 hours   Has the patient been seen for an appointment in the last year OR does the patient have an upcoming appointment? Yes.     Agent: Please be advised that RX refills may take up to 3 business days. We ask that you follow-up with your pharmacy.

## 2022-02-07 NOTE — Telephone Encounter (Signed)
Pt had called this med in on Friday the 19th, she has called today as she states it never takes this long and she has been out of med all week, Pls review, looks like not processed? Send to [place] as listed NOT [facility]

## 2022-02-07 NOTE — Telephone Encounter (Signed)
Patient called and advised she will need to schedule visit in order to receive refills. Appointment scheduled for first available 02/19/22 at 1540 with Dr. Caryn Section. She says she's out of medications and asked will she have to wait until the appointment, advised I will send this to Dr. Caryn Section and he may give enough to last until appointment. She verbalized understanding.

## 2022-02-07 NOTE — Telephone Encounter (Signed)
Requested medication (s) are due for refill today: Yes  Requested medication (s) are on the active medication list: Yes  Last refill:  01/01/22  Future visit scheduled: Yes  Notes to clinic:  Unable to refill per protocol, cannot delegate.      Requested Prescriptions  Pending Prescriptions Disp Refills   amphetamine-dextroamphetamine (ADDERALL) 10 MG tablet 60 tablet 0    Sig: Take 1 tablet (10 mg total) by mouth 2 (two) times daily.     Not Delegated - Psychiatry:  Stimulants/ADHD Failed - 02/07/2022 12:54 PM      Failed - This refill cannot be delegated      Failed - Urine Drug Screen completed in last 360 days      Failed - Valid encounter within last 6 months    Recent Outpatient Visits           1 year ago Attention deficit disorder (ADD) without hyperactivity   [facility] Birdie Sons, MD   1 year ago Attention deficit disorder (ADD) without hyperactivity   [facility] Birdie Sons, MD   2 years ago Attention deficit disorder (ADD) without hyperactivity   [facility] Birdie Sons, MD   2 years ago Attention deficit disorder (ADD) without hyperactivity   [facility] Birdie Sons, MD   2 years ago Panic disorder   [place], Kirstie Peri, MD       Future Appointments             In 1 week Fisher, Kirstie Peri, MD [facility], [place] BP in normal range    BP Readings from Last 1 Encounters:  07/07/21 107/77         Passed - Last Heart Rate in normal range    Pulse Readings from Last 1 Encounters:  07/07/21 74

## 2022-02-08 MED ORDER — AMPHETAMINE-DEXTROAMPHETAMINE 10 MG PO TABS: 10.0000 mg | ORAL_TABLET | Freq: Two times a day (BID) | ORAL | 0 refills | Status: DC

## 2022-02-19 ENCOUNTER — Encounter: Payer: Self-pay | Admitting: Family Medicine

## 2022-02-19 ENCOUNTER — Ambulatory Visit: Payer: BC Managed Care – PPO | Admitting: Family Medicine

## 2022-02-19 VITALS — BP 101/76 | HR 83 | Temp 98.0°F | Resp 16 | Wt 176.0 lb

## 2022-02-19 DIAGNOSIS — Z23 Encounter for immunization: Secondary | ICD-10-CM

## 2022-02-19 DIAGNOSIS — F988 Other specified behavioral and emotional disorders with onset usually occurring in childhood and adolescence: Secondary | ICD-10-CM | POA: Diagnosis not present

## 2022-02-19 DIAGNOSIS — F410 Panic disorder [episodic paroxysmal anxiety] without agoraphobia: Secondary | ICD-10-CM | POA: Diagnosis not present

## 2022-02-19 NOTE — Progress Notes (Signed)
I,Roshena L Chambers,acting as a scribe for Lelon Huh, MD.,have documented all relevant documentation on the behalf of Lelon Huh, MD,as directed by  Lelon Huh, MD while in the presence of Lelon Huh, MD.   Established patient visit   Patient: Jo Perkins   DOB: 22-Feb-1986   36 y.o. Female  MRN: [id number] Visit Date: 02/19/2022  Today's healthcare provider: Lelon Huh, MD   Chief Complaint  Patient presents with   ADD   Subjective    HPI  Follow up for ADD:  The patient was last seen for this 1  year  ago. Changes made at last visit include none.  She reports good compliance with treatment. She feels that condition is Unchanged. She is not having side effects.   -----------------------------------------------------------------------------------------   Follow up for panic disorder:  The patient was last seen for this 1  year  ago. Changes made at last visit include none; only using occasional clonazepam.   She reports good compliance with treatment. She feels that condition is Worse. She reports increased stress lately due to having 3 miscarriages within the past year.    -----------------------------------------------------------------------------------------   Medications: Outpatient Medications Prior to Visit  Medication Sig   amphetamine-dextroamphetamine (ADDERALL) 10 MG tablet Take 1 tablet (10 mg total) by mouth 2 (two) times daily.   clonazePAM (KLONOPIN) 0.5 MG tablet Take 0.5-1 tablets (0.25-0.5 mg total) by mouth 2 (two) times daily as needed for anxiety.   sertraline (ZOLOFT) 100 MG tablet Take 100 mg by mouth at bedtime.   WEGOVY 2.4 MG/0.75ML SOAJ SMARTSIG:2.4 Milligram(s) SUB-Q Once a Week   [DISCONTINUED] HUMIRA PEN 40 MG/0.4ML PNKT Inject 40 mg into the skin. (Patient not taking: Reported on 02/19/2022)   [DISCONTINUED] HYDROcodone-acetaminophen (NORCO/VICODIN) 5-325 MG tablet Take 1 tablet by mouth every 6 (six) hours as needed  (pain). (Patient not taking: Reported on 02/19/2022)   No facility-administered medications prior to visit.    Review of Systems  Constitutional:  Negative for appetite change, chills, fatigue and fever.  Respiratory:  Negative for chest tightness and shortness of breath.   Cardiovascular:  Negative for chest pain and palpitations.  Gastrointestinal:  Negative for abdominal pain, nausea and vomiting.  Neurological:  Negative for dizziness and weakness.      Objective    BP 101/76 (BP Location: Right Arm, Patient Position: Sitting, Cuff Size: Large)   Pulse 83   Temp 98 F (36.7 C) (Oral)   Resp 16   Wt 176 lb (79.8 kg)   LMP 01/26/2022   SpO2 100% Comment: room air  Breastfeeding No   BMI 25.99 kg/m    Physical Exam  General appearance:  Well developed, well nourished female, cooperative and in no acute distress Head: Normocephalic, without obvious abnormality, atraumatic Respiratory: Respirations even and unlabored, normal respiratory rate Extremities: All extremities are intact.  Skin: Skin color, texture, turgor normal. No rashes seen  Psych: Appropriate mood and affect. Neurologic: Mental status: Alert, oriented to person, place, and time, thought content appropriate.   Assessment & Plan     1. Attention deficit disorder (ADD) without hyperactivity Doing  very well with current dose of Adderall which she only takes on workdays.   2. Panic disorder Stable on current dose of sertraline and prn clonazepam which she typical takes 2-3 times a week   3. Need for tetanus, diphtheria, and acellular pertussis (Tdap) vaccine in patient of adolescent age or older  - Administer Tetanus-diphtheria-acellular pertussis (Tdap) vaccine  The entirety of the information documented in the History of Present Illness, Review of Systems and Physical Exam were personally obtained by me. Portions of this information were initially documented by the CMA and reviewed by me for  thoroughness and accuracy.     Lelon Huh, MD  [facility] [phone] (phone) [phone] (fax)  [place]

## 2022-02-21 ENCOUNTER — Other Ambulatory Visit: Payer: Self-pay | Admitting: Family Medicine

## 2022-04-18 DIAGNOSIS — R9431 Abnormal electrocardiogram [ECG] [EKG]: Secondary | ICD-10-CM | POA: Insufficient documentation

## 2022-04-18 DIAGNOSIS — Z8279 Family history of other congenital malformations, deformations and chromosomal abnormalities: Secondary | ICD-10-CM | POA: Insufficient documentation

## 2022-04-30 ENCOUNTER — Other Ambulatory Visit: Payer: Self-pay | Admitting: Family Medicine

## 2022-04-30 DIAGNOSIS — F988 Other specified behavioral and emotional disorders with onset usually occurring in childhood and adolescence: Secondary | ICD-10-CM

## 2022-04-30 NOTE — Telephone Encounter (Signed)
Medication Refill - Medication: Adderall 10 mg, Klonopin 0.5  Has the patient contacted their pharmacy? No. (Agent: If no, request that the patient contact the pharmacy for the refill. If patient does not wish to contact the pharmacy document the reason why and proceed with request.) (Agent: If yes, when and what did the pharmacy advise?)  Preferred Pharmacy (with phone number or street name): [facility] [address]  Has the patient been seen for an appointment in the last year OR does the patient have an upcoming appointment? Yes.    Agent: Please be advised that RX refills may take up to 3 business days. We ask that you follow-up with your pharmacy.

## 2022-05-01 MED ORDER — CLONAZEPAM 0.5 MG PO TABS: ORAL_TABLET | ORAL | 3 refills | Status: DC

## 2022-05-01 MED ORDER — AMPHETAMINE-DEXTROAMPHETAMINE 10 MG PO TABS: 10.0000 mg | ORAL_TABLET | Freq: Two times a day (BID) | ORAL | 0 refills | Status: DC

## 2022-05-01 NOTE — Telephone Encounter (Signed)
Requested medication (s) are due for refill today:yes  Requested medication (s) are on the active medication list:yes  Last refill:  02/08/22 and 03/04/22  Future visit scheduled: no  Notes to clinic:  Unable to refill per protocol, cannot delegate.      Requested Prescriptions  Pending Prescriptions Disp Refills   clonazePAM (KLONOPIN) 0.5 MG tablet 30 tablet 3     Not Delegated - Psychiatry: Anxiolytics/Hypnotics 2 Failed - 04/30/2022  3:28 PM      Failed - This refill cannot be delegated      Failed - Urine Drug Screen completed in last 360 days      Passed - Patient is not pregnant      Passed - Valid encounter within last 6 months    Recent Outpatient Visits           2 months ago Attention deficit disorder (ADD) without hyperactivity   [facility] Birdie Sons, MD   1 year ago Attention deficit disorder (ADD) without hyperactivity   [facility] Birdie Sons, MD   1 year ago Attention deficit disorder (ADD) without hyperactivity   [facility] Birdie Sons, MD   2 years ago Attention deficit disorder (ADD) without hyperactivity   [facility] Birdie Sons, MD   2 years ago Attention deficit disorder (ADD) without hyperactivity   [facility] Birdie Sons, MD               amphetamine-dextroamphetamine (ADDERALL) 10 MG tablet 60 tablet 0    Sig: Take 1 tablet (10 mg total) by mouth 2 (two) times daily.     Not Delegated - Psychiatry:  Stimulants/ADHD Failed - 04/30/2022  3:28 PM      Failed - This refill cannot be delegated      Failed - Urine Drug Screen completed in last 360 days      Passed - Last BP in normal range    BP Readings from Last 1 Encounters:  02/19/22 101/76         Passed - Last Heart Rate in normal range    Pulse Readings from Last 1 Encounters:  02/19/22 83         Passed - Valid encounter within last 6 months    Recent Outpatient Visits            2 months ago Attention deficit disorder (ADD) without hyperactivity   [facility] Birdie Sons, MD   1 year ago Attention deficit disorder (ADD) without hyperactivity   [facility] Birdie Sons, MD   1 year ago Attention deficit disorder (ADD) without hyperactivity   [facility] Birdie Sons, MD   2 years ago Attention deficit disorder (ADD) without hyperactivity   [facility] Birdie Sons, MD   2 years ago Attention deficit disorder (ADD) without hyperactivity   [facility] Birdie Sons, MD

## 2022-07-04 ENCOUNTER — Other Ambulatory Visit: Payer: Self-pay | Admitting: Family Medicine

## 2022-07-04 DIAGNOSIS — F988 Other specified behavioral and emotional disorders with onset usually occurring in childhood and adolescence: Secondary | ICD-10-CM

## 2022-07-04 MED ORDER — AMPHETAMINE-DEXTROAMPHETAMINE 10 MG PO TABS: 10.0000 mg | ORAL_TABLET | Freq: Two times a day (BID) | ORAL | 0 refills | Status: DC

## 2022-07-04 NOTE — Telephone Encounter (Signed)
Medication Refill - Medication: amphetamine-dextroamphetamine (ADDERALL) 10 MG tablet   Has the patient contacted their pharmacy? No. (Agent: If no, request that the patient contact the pharmacy for the refill. If patient does not wish to contact the pharmacy document the reason why and proceed with request.) (Agent: If yes, when and what did the pharmacy advise?)  Preferred Pharmacy (with phone number or street name): [facility] [place], [place] - [address]  [place], [place] [address]  Phone:  [phone]  Fax:  [phone]  DEA #:  [id number] Has the patient been seen for an appointment in the last year OR does the patient have an upcoming appointment? Yes.  Pt schedule appt for 10.24.23  Agent: Please be advised that RX refills may take up to 3 business days. We ask that you follow-up with your pharmacy.

## 2022-07-04 NOTE — Telephone Encounter (Signed)
Requested medication (s) are due for refill today: yes  Requested medication (s) are on the active medication list: yes    Last refill: 05/01/22  #60  0 refills  Future visit scheduled yes 07/10/22  Notes to clinic:Not delegated, please review.  Requested Prescriptions  Pending Prescriptions Disp Refills   amphetamine-dextroamphetamine (ADDERALL) 10 MG tablet 60 tablet 0    Sig: Take 1 tablet (10 mg total) by mouth 2 (two) times daily.     Not Delegated - Psychiatry:  Stimulants/ADHD Failed - 07/04/2022  4:29 PM      Failed - This refill cannot be delegated      Failed - Urine Drug Screen completed in last 360 days      Passed - Last BP in normal range    BP Readings from Last 1 Encounters:  02/19/22 101/76         Passed - Last Heart Rate in normal range    Pulse Readings from Last 1 Encounters:  02/19/22 83         Passed - Valid encounter within last 6 months    Recent Outpatient Visits           4 months ago Attention deficit disorder (ADD) without hyperactivity   [facility] Birdie Sons, MD   1 year ago Attention deficit disorder (ADD) without hyperactivity   [facility] Birdie Sons, MD   1 year ago Attention deficit disorder (ADD) without hyperactivity   [facility] Birdie Sons, MD   2 years ago Attention deficit disorder (ADD) without hyperactivity   [facility] Birdie Sons, MD   2 years ago Attention deficit disorder (ADD) without hyperactivity   [facility] Birdie Sons, MD       Future Appointments             In 6 days [place], [place], [place], [place]

## 2022-07-10 ENCOUNTER — Ambulatory Visit: Payer: BC Managed Care – PPO | Admitting: Family Medicine

## 2022-07-10 ENCOUNTER — Encounter: Payer: Self-pay | Admitting: Family Medicine

## 2022-07-10 DIAGNOSIS — F988 Other specified behavioral and emotional disorders with onset usually occurring in childhood and adolescence: Secondary | ICD-10-CM | POA: Diagnosis not present

## 2022-07-10 MED ORDER — AMPHETAMINE-DEXTROAMPHETAMINE 10 MG PO TABS: 10.0000 mg | ORAL_TABLET | Freq: Two times a day (BID) | ORAL | 0 refills | Status: DC

## 2022-07-10 NOTE — Progress Notes (Signed)
    SUBJECTIVE:   CHIEF COMPLAINT / HPI:   ADHD - Meds: adderall - Last follow up: 02/19/22 - Denies loss of appetite, GI upset, sleep disturbances, vision changes, behavior problems - Weight changes? Has lost some weight, attributes to beginning of year stress.  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   - Relationships at home and with peers: good - takes on weekdays.    OBJECTIVE:   BP 110/79 (BP Location: Left Arm, Patient Position: Sitting, Cuff Size: Normal)   Pulse 71   Resp 14   Wt 166 lb (75.3 kg)   SpO2 98%   BMI 24.51 kg/m   Gen: well appearing, in NAD Card: RRR Lungs: CTAB Ext: WWP, no edema  ASSESSMENT/PLAN:   Attention deficit disorder (ADD) without hyperactivity Doing well on current regimen, no changes made today PDMP reviewed. Refills sent.      Myles Gip, DO

## 2022-07-10 NOTE — Assessment & Plan Note (Addendum)
Doing well on current regimen, no changes made today PDMP reviewed. Refills sent.

## 2022-08-22 ENCOUNTER — Other Ambulatory Visit: Payer: Self-pay | Admitting: Family Medicine

## 2022-08-22 DIAGNOSIS — F988 Other specified behavioral and emotional disorders with onset usually occurring in childhood and adolescence: Secondary | ICD-10-CM

## 2022-08-22 NOTE — Telephone Encounter (Signed)
Requested medication (s) are due for refill today:   Provider to review  Requested medication (s) are on the active medication list:   Yes  Future visit scheduled:   Yes   Seen a mo. ago   Last ordered: 07/10/2022 #60, 0 refills  Non delegated refill reason returned   Requested Prescriptions  Pending Prescriptions Disp Refills   amphetamine-dextroamphetamine (ADDERALL) 10 MG tablet 60 tablet 0    Sig: Take 1 tablet (10 mg total) by mouth 2 (two) times daily.     Not Delegated - Psychiatry:  Stimulants/ADHD Failed - 08/22/2022 12:24 PM      Failed - This refill cannot be delegated      Failed - Urine Drug Screen completed in last 360 days      Passed - Last BP in normal range    BP Readings from Last 1 Encounters:  07/10/22 110/79         Passed - Last Heart Rate in normal range    Pulse Readings from Last 1 Encounters:  07/10/22 71         Passed - Valid encounter within last 6 months    Recent Outpatient Visits           1 month ago Attention deficit disorder (ADD) without hyperactivity   [facility] Myles Gip, DO   6 months ago Attention deficit disorder (ADD) without hyperactivity   [facility] Birdie Sons, MD   1 year ago Attention deficit disorder (ADD) without hyperactivity   [facility] Birdie Sons, MD   2 years ago Attention deficit disorder (ADD) without hyperactivity   [facility] Birdie Sons, MD   2 years ago Attention deficit disorder (ADD) without hyperactivity   [facility] Birdie Sons, MD

## 2022-08-22 NOTE — Telephone Encounter (Signed)
Medication Refill - Medication: amphetamine-dextroamphetamine (ADDERALL) 10 MG tablet   Has the patient contacted their pharmacy? No. No, more refills. (Agent: If no, request that the patient contact the pharmacy for the refill. If patient does not wish to contact the pharmacy document the reason why and proceed with request.)   Preferred Pharmacy (with phone number or street name):  [id number] [place], [place] - [address]  [place] [address]  Phone: [phone] Fax: [phone]  Hours: Not open 24 hours   Has the patient been seen for an appointment in the last year OR does the patient have an upcoming appointment? Yes.    Agent: Please be advised that RX refills may take up to 3 business days. We ask that you follow-up with your pharmacy.

## 2022-08-23 MED ORDER — AMPHETAMINE-DEXTROAMPHETAMINE 10 MG PO TABS: 10.0000 mg | ORAL_TABLET | Freq: Two times a day (BID) | ORAL | 0 refills | Status: DC

## 2022-09-17 NOTE — L&D Delivery Note (Signed)
Delivery Note At 10:47 PM a viable female was delivered via Vaginal, [place] Forceps (Presentation: ROT / VTX      ).  APGAR: 3, 8; weight 9 lb 12.3 oz (4430 g).   Placenta status: Spontaneous, Intact.  Cord: 3 vessels with the following complications: None.   Low forceps 90 degree rotational delivery. 2 set of pulls with delivery of head . Reduction of nuchal cord followed by delivery of shoulders and body . Nursery staff attended to female . Cord clamped and cut at 10 seconds. Baby to warmer .  See dictated delivery note  Anesthesia: Epidural Episiotomy: None Lacerations:  left vaginal side wall- second degree laceration  Suture Repair: 2.0 3.0 vicryl Est. Blood Loss (mL):  ebl  =700cc   Mom to postpartum.  Baby to Couplet care / Skin to Skin.  Jo Perkins 08/12/2023, 11:31 PM

## 2022-10-04 ENCOUNTER — Other Ambulatory Visit: Payer: Self-pay | Admitting: Family Medicine

## 2022-10-04 NOTE — Telephone Encounter (Signed)
Please review. Last office visit 07/10/2022.  KP

## 2022-12-25 DIAGNOSIS — O2620 Pregnancy care for patient with recurrent pregnancy loss, unspecified trimester: Secondary | ICD-10-CM | POA: Insufficient documentation

## 2023-01-21 DIAGNOSIS — O09521 Supervision of elderly multigravida, first trimester: Secondary | ICD-10-CM | POA: Insufficient documentation

## 2023-01-21 DIAGNOSIS — O0993 Supervision of high risk pregnancy, unspecified, third trimester: Secondary | ICD-10-CM | POA: Insufficient documentation

## 2023-01-21 LAB — OB RESULTS CONSOLE HIV ANTIBODY (ROUTINE TESTING): HIV: NONREACTIVE

## 2023-01-21 LAB — OB RESULTS CONSOLE VARICELLA ZOSTER ANTIBODY, IGG: Varicella: IMMUNE

## 2023-01-21 LAB — OB RESULTS CONSOLE HEPATITIS B SURFACE ANTIGEN: Hepatitis B Surface Ag: NEGATIVE

## 2023-01-21 LAB — OB RESULTS CONSOLE RPR: RPR: NONREACTIVE

## 2023-01-21 LAB — OB RESULTS CONSOLE RUBELLA ANTIBODY, IGM: Rubella: IMMUNE

## 2023-07-03 ENCOUNTER — Other Ambulatory Visit: Payer: Self-pay

## 2023-07-03 ENCOUNTER — Observation Stay
Admission: EM | Admit: 2023-07-03 | Discharge: 2023-07-03 | Disposition: A | Discharge status: Home / Self Care | Payer: BC Managed Care – PPO | Attending: Obstetrics and Gynecology | Admitting: Obstetrics and Gynecology

## 2023-07-03 DIAGNOSIS — M545 Low back pain, unspecified: Secondary | ICD-10-CM | POA: Diagnosis not present

## 2023-07-03 DIAGNOSIS — Z79899 Other long term (current) drug therapy: Secondary | ICD-10-CM | POA: Diagnosis not present

## 2023-07-03 DIAGNOSIS — Z3A33 33 weeks gestation of pregnancy: Secondary | ICD-10-CM | POA: Insufficient documentation

## 2023-07-03 DIAGNOSIS — O4703 False labor before 37 completed weeks of gestation, third trimester: Secondary | ICD-10-CM | POA: Insufficient documentation

## 2023-07-03 DIAGNOSIS — Z7982 Long term (current) use of aspirin: Secondary | ICD-10-CM | POA: Insufficient documentation

## 2023-07-03 DIAGNOSIS — O26893 Other specified pregnancy related conditions, third trimester: Secondary | ICD-10-CM | POA: Diagnosis present

## 2023-07-03 DIAGNOSIS — R109 Unspecified abdominal pain: Secondary | ICD-10-CM | POA: Diagnosis not present

## 2023-07-03 DIAGNOSIS — O4700 False labor before 37 completed weeks of gestation, unspecified trimester: Principal | ICD-10-CM | POA: Diagnosis present

## 2023-07-03 LAB — URINALYSIS, COMPLETE (UACMP) WITH MICROSCOPIC
Bilirubin Urine: NEGATIVE
Glucose, UA: NEGATIVE mg/dL
Hgb urine dipstick: NEGATIVE
Ketones, ur: NEGATIVE mg/dL
Leukocytes,Ua: NEGATIVE
Nitrite: NEGATIVE
Protein, ur: NEGATIVE mg/dL
Specific Gravity, Urine: 1.002 — ABNORMAL LOW (ref 1.005–1.030)
pH: 7 (ref 5.0–8.0)

## 2023-07-03 LAB — RPR: RPR Ser Ql: NONREACTIVE

## 2023-07-03 LAB — OB RESULTS CONSOLE GC/CHLAMYDIA
Chlamydia: NEGATIVE
Neisseria Gonorrhea: NEGATIVE

## 2023-07-03 LAB — RAPID HIV SCREEN (HIV 1/2 AB+AG)
HIV 1/2 Antibodies: NONREACTIVE
HIV-1 P24 Antigen - HIV24: NONREACTIVE

## 2023-07-03 LAB — WET PREP, GENITAL
Clue Cells Wet Prep HPF POC: NONE SEEN
Sperm: NONE SEEN
Trich, Wet Prep: NONE SEEN
WBC, Wet Prep HPF POC: >=10 — AB (ref <10)
Yeast Wet Prep HPF POC: NONE SEEN

## 2023-07-03 LAB — RUPTURE OF MEMBRANE (ROM)PLUS: Rom Plus: NEGATIVE

## 2023-07-03 LAB — TYPE AND SCREEN
ABO/RH(D): A POS
Antibody Screen: NEGATIVE

## 2023-07-03 LAB — CBC
HCT: 30.6 % — ABNORMAL LOW (ref 36.0–46.0)
Hemoglobin: 10.2 g/dL — ABNORMAL LOW (ref 12.0–15.0)
MCH: 28.7 pg (ref 26.0–34.0)
MCHC: 33.3 g/dL (ref 30.0–36.0)
MCV: 86.2 fL (ref 80.0–100.0)
Platelets: 195 10*3/uL (ref 150–400)
RBC: 3.55 MIL/uL — ABNORMAL LOW (ref 3.87–5.11)
RDW: 12.2 % (ref 11.5–15.5)
WBC: 10 10*3/uL (ref 4.0–10.5)
nRBC: 0.2 % (ref 0.0–0.2)

## 2023-07-03 LAB — OB RESULTS CONSOLE GBS: GBS: POSITIVE

## 2023-07-03 LAB — CHLAMYDIA/NGC RT PCR (ARMC ONLY)
Chlamydia Tr: NOT DETECTED
N gonorrhoeae: NOT DETECTED

## 2023-07-03 LAB — GROUP B STREP BY PCR: Group B strep by PCR: POSITIVE — AB

## 2023-07-03 LAB — FETAL FIBRONECTIN: Fetal Fibronectin: NEGATIVE

## 2023-07-03 MED ORDER — CALCIUM CARBONATE ANTACID 500 MG PO CHEW: 2.0000 | CHEWABLE_TABLET | ORAL | Status: DC | PRN

## 2023-07-03 MED ORDER — ACETAMINOPHEN 500 MG PO TABS
1000.0000 mg | ORAL_TABLET | Freq: Four times a day (QID) | ORAL | Status: DC | PRN
  Administered 2023-07-03: 1000 mg via ORAL
  Filled 2023-07-03: qty 2

## 2023-07-03 NOTE — Progress Notes (Signed)
Patient is a G4P0 at 33wk4d reporting lower back and abdominal pain since 2145 last night. Patient reports the back pain a constant 7/10, and the abdominal pain to be an intermittent lesser pain. Patient reports +FM, but denies leaking of fluid or vaginal bleeding. Tami Lin, CNM notified of patient's arrival.

## 2023-07-03 NOTE — H&P (Signed)
Jo Perkins is a 37 y.o. female. She is at [redacted]w[redacted]d gestation. No LMP recorded. Patient is pregnant. 08/17/2023, by Patient Reported   Prenatal care site: [facility]  Chief complaint: lower back pain and abdominal cramping   Admission Diagnoses:  1) intrauterine pregnancy at [redacted]w[redacted]d  2) Preterm contractions [O47.00]   HPI: Jo Perkins presents to L&D with complaints of lower back pain and abdomial cramping.  Her symptoms started around 2145 and became progressively worse. Denies  a gush of fluid but states she's had an increase in vaginal discharge and possible leaking today. Her pregnancy is complicated by AMA, macrosomia, recurrent pregnancy loss with idiopathic etiology, fundal fibroid, and ulcerative colitis .  She denies Vaginal bleeding. Endorses fetal movement as active.   S: Resting comfortably. no VB.no active LOF,  Active fetal movement.   Maternal Medical History:  Past Medical Hx:  has a past medical history of Acute pancreatitis (05/05/2016), Depression, History of colon polyps, Pancreatitis, acute, Panic attack, and Ulcerative colitis (HCC).    Past Surgical Hx:  has a past surgical history that includes none; Colonoscopy; and Dilation and curettage of uterus (N/A, 07/07/2021).   Allergies  Allergen Reactions   Imuran  [Azathioprine]     caused pancreatitis     Prior to Admission medications   Medication Sig Start Date End Date Taking? Authorizing Provider  aspirin EC 81 MG tablet Take 81 mg by mouth daily. Swallow whole.   Yes [provider]  Prenatal Vit-Fe Fumarate-FA (MULTIVITAMIN-PRENATAL) 27-0.8 MG TABS tablet Take 1 tablet by mouth daily at 12 noon.   Yes [provider]  sertraline (ZOLOFT) 100 MG tablet Take 100 mg by mouth at bedtime.   Yes [provider]  amphetamine-dextroamphetamine (ADDERALL) 10 MG tablet Take 1 tablet (10 mg total) by mouth 2 (two) times daily. Patient not taking: Reported on 07/03/2023 07/10/22   Caro Laroche, DO  amphetamine-dextroamphetamine (ADDERALL) 10 MG tablet Take 1 tablet (10 mg total) by mouth 2 (two) times daily. Patient not taking: Reported on 07/03/2023 08/23/22   Malva Limes, MD  clonazePAM (KLONOPIN) 0.5 MG tablet TAKE 1/2 TO 1 TABLET(0.25 TO 0.5 MG) BY MOUTH TWICE DAILY AS NEEDED FOR ANXIETY Patient not taking: Reported on 07/03/2023 10/04/22   Malva Limes, MD  WEGOVY 2.4 MG/0.75ML SOAJ SMARTSIG:2.4 Milligram(s) SUB-Q Once a Week Patient not taking: Reported on 07/10/2022 02/06/22   [provider]    Social History: She  reports that she has never smoked. She has never used smokeless tobacco. She reports that she does not currently use alcohol. She reports that she does not use drugs.  Family History: family history includes Diabetes in her maternal grandmother; Heart attack in her maternal grandmother; Lung cancer in her maternal grandfather; Stroke in her maternal grandmother.   Review of Systems: A full review of systems was performed and negative except as noted in the HPI.     Pertinent Results:  Prenatal Labs: Blood type/Rh A POS Performed at [facility], [address] [place], [place] [address]    Antibody screen Negative    Rubella Immune    Varicella Immune  RPR NR    HBsAg Neg   Hep C NR   HIV NR    GC neg  Chlamydia neg  Genetic screening cfDNA negative  1 hour GTT 126  3 hour GTT N/A  GBS Unknown       O:  BP 129/76   Pulse 72  Temp 98.5 F (36.9 C) (Oral)   Ht 5\' 9"  (1.753 m)   BMI 24.51 kg/m  No results found for this or any previous visit (from the past 48 hour(s)).   Constitutional: NAD, AAOx3  PULM: nl respiratory effort Abd: gravid, non-tender Ext: Non-tender, Nonedmeatous Psych: mood appropriate, speech normal Pelvic : moderate amount of physiologic discharge, negative pooling  SVE: Dilation: 1 Effacement (%): Thick Station: -2 Exam by:: Tami Lin, CNM    NST: Baseline FHR: 125  beats/min Variability: moderate Accelerations: present Decelerations: absent Tocometry: contractions 2-3 min, mild to palpation  Time: at least 20 minutes   Interpretation: Category I INDICATIONS: rule out uterine contractions RESULTS:  A NST procedure was performed with FHR monitoring and a normal baseline established, appropriate time of 20-40 minutes of evaluation, and accels >2 seen w 15x15 characteristics.  Results show a REACTIVE NST.    Plan: 1) Reactive NST  -Category 1 tracing  -Reassuring fetal status   2) Preterm contractions  -FFN, Wet prep, GC/CT, and GBS collected -ROM + collected  -Fern negative and pooling negative -UA collected  -Encouraged PO hydration - will consider IV hydration as indicated  -Will discuss assessment with Dr. Algis Downs. [place]  ----- Gustavo Lah, CNM  Certified Nurse Midwife [facility]

## 2023-07-03 NOTE — OB Triage Note (Signed)
Discharge instructions, labor precautions, and follow-up care reviewed with patient and significant other. All questions answered. Patient verbalized understanding. Discharged ambulatory off unit.

## 2023-07-03 NOTE — Discharge Summary (Signed)
Patient ID: Jo Perkins MRN: [id number] DOB/AGE: 12/12/1985 37 y.o.  Admit date: 07/03/2023 Discharge date: 07/03/2023  Admission Diagnoses: 37yo G4P0 at [redacted]w[redacted]d presents with lower back pain and abdominal cramping with possible leaking of fluid.   Discharge Diagnoses: Membranes intact, UCs resolved  Factors complicating pregnancy: AMA Macrosomia Recurrent pregnancy loss with idiopathic etiology Fundal fibroid Ulcerative colitis   Prenatal Procedures: NST  Consults: None  Significant Diagnostic Studies:  Results for orders placed or performed during the hospital encounter of 07/03/23 (from the past 168 hour(s))  Wet prep, genital   Collection Time: 07/03/23  1:38 AM  Result Value Ref Range   Yeast Wet Prep HPF POC NONE SEEN NONE SEEN   Trich, Wet Prep NONE SEEN NONE SEEN   Clue Cells Wet Prep HPF POC NONE SEEN NONE SEEN   WBC, Wet Prep HPF POC >=10 (A) <10   Sperm NONE SEEN   Chlamydia/NGC rt PCR ([facility] only)   Collection Time: 07/03/23  1:38 AM  Result Value Ref Range   Specimen source GC/Chlam ENDOCERVICAL    Chlamydia Tr NOT DETECTED NOT DETECTED   N gonorrhoeae NOT DETECTED NOT DETECTED  Group B strep by PCR   Collection Time: 07/03/23  1:38 AM   Specimen: Genital  Result Value Ref Range   Group B strep by PCR POSITIVE (A) PRESUMPTIVE NEGATIVE  Fetal fibronectin   Collection Time: 07/03/23  1:38 AM  Result Value Ref Range   Fetal Fibronectin NEGATIVE NEGATIVE  Urinalysis, Complete w Microscopic -Urine, Clean Catch   Collection Time: 07/03/23  1:38 AM  Result Value Ref Range   Color, Urine COLORLESS (A) YELLOW   APPearance CLEAR (A) CLEAR   Specific Gravity, Urine 1.002 (L) 1.005 - 1.030   pH 7.0 5.0 - 8.0   Glucose, UA NEGATIVE NEGATIVE mg/dL   Hgb urine dipstick NEGATIVE NEGATIVE   Bilirubin Urine NEGATIVE NEGATIVE   Ketones, ur NEGATIVE NEGATIVE mg/dL   Protein, ur NEGATIVE NEGATIVE mg/dL   Nitrite NEGATIVE NEGATIVE   Leukocytes,Ua NEGATIVE NEGATIVE    RBC / HPF 0-5 0 - 5 RBC/hpf   WBC, UA 0-5 0 - 5 WBC/hpf   Bacteria, UA RARE (A) NONE SEEN   Squamous Epithelial / HPF 0-5 0 - 5 /HPF  Rupture of Membrane (ROM) Plus   Collection Time: 07/03/23  1:48 AM  Result Value Ref Range   Rom Plus NEGATIVE   CBC   Collection Time: 07/03/23  3:36 AM  Result Value Ref Range   WBC 10.0 4.0 - 10.5 K/uL   RBC 3.55 (L) 3.87 - 5.11 MIL/uL   Hemoglobin 10.2 (L) 12.0 - 15.0 g/dL   HCT 40.9 (L) 81.1 - 91.4 %   MCV 86.2 80.0 - 100.0 fL   MCH 28.7 26.0 - 34.0 pg   MCHC 33.3 30.0 - 36.0 g/dL   RDW 78.2 95.6 - 21.3 %   Platelets 195 150 - 400 K/uL   nRBC 0.2 0.0 - 0.2 %  Rapid HIV screen (HIV 1/2 Ab+Ag)   Collection Time: 07/03/23  3:36 AM  Result Value Ref Range   HIV-1 P24 Antigen - HIV24 NON REACTIVE NON REACTIVE   HIV 1/2 Antibodies NON REACTIVE NON REACTIVE   Interpretation (HIV Ag Ab)      A non reactive test result means that HIV 1 or HIV 2 antibodies and HIV 1 p24 antigen were not detected in the specimen.  Type and screen [facility]   Collection Time: 07/03/23  3:36 AM  Result Value Ref Range   ABO/RH(D) A POS    Antibody Screen NEG    Sample Expiration      07/06/2023,2359 Performed at [facility], [address] [place], [place] [address]     Treatments: PO hydration  Hospital Course:  This is a 37 y.o. G4P0030 with IUP at [redacted]w[redacted]d seen to r/o PTL, noted to have a cervical exam of 1/TH/-2.  No leaking of fluid and no bleeding.  Labs were collected - results noted above.  She was observed over night and contractions resolved.  Fetal heart rate monitoring was reassuring, and she had no signs/symptoms of preterm labor or other maternal-fetal concerns.  Her cervical exam was unchanged from admission.  She was deemed stable for discharge to home with outpatient follow up.  Discharge Physical Exam:  BP 129/76   Pulse 72   Temp 98.5 F (36.9 C) (Oral)   Ht 5\' 9"  (1.753 m)   BMI 24.51 kg/m   General:  NAD CV: RRR Pulm: nl effort ABD: s/nd/nt, gravid DVT Evaluation: LE non-ttp, no evidence of DVT on exam.  NST: FHR baseline: 120 bpm Variability: moderate Accelerations: yes Decelerations: none Time: 20 minutes Category/reactivity: reactive   TOCO: quiet at discharge SVE:  Dilation: 1 Effacement (%): Thick Station: -2 Exam by:: A English as a second language teacher   Discharge Condition: Stable  Disposition: Discharge disposition: 01-Home or Self Care        Allergies as of 07/03/2023       Reactions   Imuran  [azathioprine]    caused pancreatitis        Medication List     STOP taking these medications    amphetamine-dextroamphetamine 10 MG tablet Commonly known as: Adderall   clonazePAM 0.5 MG tablet Commonly known as: KLONOPIN   Wegovy 2.4 MG/0.75ML Soaj Generic drug: Semaglutide-Weight Management       TAKE these medications    aspirin EC 81 MG tablet Take 81 mg by mouth daily. Swallow whole.   multivitamin-prenatal 27-0.8 MG Tabs tablet Take 1 tablet by mouth daily at 12 noon.   sertraline 100 MG tablet Commonly known as: ZOLOFT Take 100 mg by mouth at bedtime.        Follow-up Information     [facility] Follow up.   Why: Keep all scheduled appointments Contact information: [address] [place] [address] [phone]                Signed:  Quillian Quince 07/03/2023 9:44 AM

## 2023-07-30 DIAGNOSIS — O409XX0 Polyhydramnios, unspecified trimester, not applicable or unspecified: Secondary | ICD-10-CM | POA: Diagnosis present

## 2023-08-11 ENCOUNTER — Other Ambulatory Visit: Payer: Self-pay | Admitting: Certified Nurse Midwife

## 2023-08-11 DIAGNOSIS — Z3490 Encounter for supervision of normal pregnancy, unspecified, unspecified trimester: Secondary | ICD-10-CM

## 2023-08-11 NOTE — Progress Notes (Signed)
[id number] at [redacted]w[redacted]d, LMP of 11/10/22, c/w early [place] at [redacted]w[redacted]d.  Scheduled for induction of labor for AMA, ulcerative colitis, and LGA on 08/12/23.   Prenatal provider: [facility] Pregnancy complicated by: LGA ([id number] on 07/24/23) Ulcerative colitis AMA Uterine fibroids GBS positive Anemia Polyhydramnios  Prenatal Labs: Blood type/Rh A pos  Antibody screen neg  Rubella Immune  Varicella Immune  RPR NR  HBsAg Neg  HIV NR  GC neg  Chlamydia neg  Genetic screening cfDNA negative   1 hour GTT 126  3 hour GTT    GBS positive   Tdap: received AP Flu: received through her job RSV: received AP Contraception: undecided Feeding preference: undecided  ____ Janyce Llanos, CNM  Certified Nurse Midwife [facility]

## 2023-08-12 ENCOUNTER — Inpatient Hospital Stay: Payer: BC Managed Care – PPO | Admitting: Anesthesiology

## 2023-08-12 ENCOUNTER — Other Ambulatory Visit: Payer: Self-pay

## 2023-08-12 ENCOUNTER — Inpatient Hospital Stay
Admission: EM | Admit: 2023-08-12 | Discharge: 2023-08-14 | DRG: 806 | Disposition: A | Discharge status: Home / Self Care | Payer: BC Managed Care – PPO | Attending: Obstetrics and Gynecology | Admitting: Obstetrics and Gynecology

## 2023-08-12 ENCOUNTER — Encounter: Payer: Self-pay | Admitting: Obstetrics and Gynecology

## 2023-08-12 DIAGNOSIS — O3413 Maternal care for benign tumor of corpus uteri, third trimester: Secondary | ICD-10-CM | POA: Diagnosis present

## 2023-08-12 DIAGNOSIS — D259 Leiomyoma of uterus, unspecified: Secondary | ICD-10-CM | POA: Diagnosis present

## 2023-08-12 DIAGNOSIS — Z79899 Other long term (current) drug therapy: Secondary | ICD-10-CM

## 2023-08-12 DIAGNOSIS — O99824 Streptococcus B carrier state complicating childbirth: Secondary | ICD-10-CM | POA: Diagnosis present

## 2023-08-12 DIAGNOSIS — Z8249 Family history of ischemic heart disease and other diseases of the circulatory system: Secondary | ICD-10-CM

## 2023-08-12 DIAGNOSIS — Z833 Family history of diabetes mellitus: Secondary | ICD-10-CM

## 2023-08-12 DIAGNOSIS — Z801 Family history of malignant neoplasm of trachea, bronchus and lung: Secondary | ICD-10-CM

## 2023-08-12 DIAGNOSIS — O3660X0 Maternal care for excessive fetal growth, unspecified trimester, not applicable or unspecified: Secondary | ICD-10-CM | POA: Diagnosis present

## 2023-08-12 DIAGNOSIS — O2623 Pregnancy care for patient with recurrent pregnancy loss, third trimester: Secondary | ICD-10-CM | POA: Diagnosis present

## 2023-08-12 DIAGNOSIS — O328XX0 Maternal care for other malpresentation of fetus, not applicable or unspecified: Secondary | ICD-10-CM | POA: Diagnosis present

## 2023-08-12 DIAGNOSIS — Z7982 Long term (current) use of aspirin: Secondary | ICD-10-CM | POA: Diagnosis not present

## 2023-08-12 DIAGNOSIS — O3663X0 Maternal care for excessive fetal growth, third trimester, not applicable or unspecified: Principal | ICD-10-CM | POA: Diagnosis present

## 2023-08-12 DIAGNOSIS — O09521 Supervision of elderly multigravida, first trimester: Secondary | ICD-10-CM | POA: Diagnosis present

## 2023-08-12 DIAGNOSIS — K5190 Ulcerative colitis, unspecified, without complications: Secondary | ICD-10-CM | POA: Diagnosis present

## 2023-08-12 DIAGNOSIS — Z3490 Encounter for supervision of normal pregnancy, unspecified, unspecified trimester: Principal | ICD-10-CM | POA: Diagnosis present

## 2023-08-12 DIAGNOSIS — Z823 Family history of stroke: Secondary | ICD-10-CM | POA: Diagnosis not present

## 2023-08-12 DIAGNOSIS — F419 Anxiety disorder, unspecified: Secondary | ICD-10-CM | POA: Diagnosis present

## 2023-08-12 DIAGNOSIS — Z888 Allergy status to other drugs, medicaments and biological substances status: Secondary | ICD-10-CM

## 2023-08-12 DIAGNOSIS — Z3A39 39 weeks gestation of pregnancy: Secondary | ICD-10-CM | POA: Diagnosis not present

## 2023-08-12 DIAGNOSIS — O409XX0 Polyhydramnios, unspecified trimester, not applicable or unspecified: Secondary | ICD-10-CM | POA: Diagnosis present

## 2023-08-12 DIAGNOSIS — O9962 Diseases of the digestive system complicating childbirth: Secondary | ICD-10-CM | POA: Diagnosis present

## 2023-08-12 DIAGNOSIS — O99340 Other mental disorders complicating pregnancy, unspecified trimester: Secondary | ICD-10-CM | POA: Diagnosis present

## 2023-08-12 LAB — COMPREHENSIVE METABOLIC PANEL
ALT: 9 U/L (ref 0–44)
AST: 19 U/L (ref 15–41)
Albumin: 2.6 g/dL — ABNORMAL LOW (ref 3.5–5.0)
Alkaline Phosphatase: 136 U/L — ABNORMAL HIGH (ref 38–126)
Anion gap: 11 (ref 5–15)
BUN: 10 mg/dL (ref 6–20)
CO2: 18 mmol/L — ABNORMAL LOW (ref 22–32)
Calcium: 8.6 mg/dL — ABNORMAL LOW (ref 8.9–10.3)
Chloride: 100 mmol/L (ref 98–111)
Creatinine, Ser: 0.52 mg/dL (ref 0.44–1.00)
GFR, Estimated: >60 mL/min (ref >60)
Glucose, Bld: 117 mg/dL — ABNORMAL HIGH (ref 70–99)
Potassium: 3.4 mmol/L — ABNORMAL LOW (ref 3.5–5.1)
Sodium: 129 mmol/L — ABNORMAL LOW (ref 135–145)
Total Bilirubin: 0.5 mg/dL (ref <1.2)
Total Protein: 6.7 g/dL (ref 6.5–8.1)

## 2023-08-12 LAB — PROTEIN / CREATININE RATIO, URINE
Creatinine, Urine: 115 mg/dL
Protein Creatinine Ratio: 0.21 mg/mg{creat} — ABNORMAL HIGH (ref 0.00–0.15)
Total Protein, Urine: 24 mg/dL

## 2023-08-12 LAB — CBC
HCT: 31.3 % — ABNORMAL LOW (ref 36.0–46.0)
Hemoglobin: 10.6 g/dL — ABNORMAL LOW (ref 12.0–15.0)
MCH: 28.1 pg (ref 26.0–34.0)
MCHC: 33.9 g/dL (ref 30.0–36.0)
MCV: 83 fL (ref 80.0–100.0)
Platelets: 232 10*3/uL (ref 150–400)
RBC: 3.77 MIL/uL — ABNORMAL LOW (ref 3.87–5.11)
RDW: 13.2 % (ref 11.5–15.5)
WBC: 11.7 10*3/uL — ABNORMAL HIGH (ref 4.0–10.5)
nRBC: 0 % (ref 0.0–0.2)

## 2023-08-12 LAB — TYPE AND SCREEN
ABO/RH(D): A POS
Antibody Screen: NEGATIVE

## 2023-08-12 LAB — RPR: RPR Ser Ql: NONREACTIVE

## 2023-08-12 MED ORDER — SODIUM CHLORIDE 0.9 % IV SOLN
INTRAVENOUS | Status: DC | PRN
  Administered 2023-08-12: 5 mL via EPIDURAL

## 2023-08-12 MED ORDER — OXYTOCIN-SODIUM CHLORIDE 30-0.9 UT/500ML-% IV SOLN
2.5000 [IU]/h | INTRAVENOUS | Status: DC
  Administered 2023-08-12: 2.5 [IU]/h via INTRAVENOUS
  Filled 2023-08-12: qty 500

## 2023-08-12 MED ORDER — MISOPROSTOL 200 MCG PO TABS
ORAL_TABLET | ORAL | Status: AC
  Filled 2023-08-12: qty 4

## 2023-08-12 MED ORDER — FENTANYL CITRATE (PF) 100 MCG/2ML IJ SOLN: 50.0000 ug | INTRAMUSCULAR | Status: DC | PRN

## 2023-08-12 MED ORDER — PHENYLEPHRINE 80 MCG/ML (10ML) SYRINGE FOR IV PUSH (FOR BLOOD PRESSURE SUPPORT): 80.0000 ug | PREFILLED_SYRINGE | INTRAVENOUS | Status: DC | PRN

## 2023-08-12 MED ORDER — METHYLERGONOVINE MALEATE 0.2 MG/ML IJ SOLN
INTRAMUSCULAR | Status: AC
  Filled 2023-08-12: qty 1

## 2023-08-12 MED ORDER — FENTANYL-BUPIVACAINE-NACL 0.5-0.125-0.9 MG/250ML-% EP SOLN
EPIDURAL | Status: AC
  Filled 2023-08-12: qty 250

## 2023-08-12 MED ORDER — LIDOCAINE HCL (PF) 1 % IJ SOLN
INTRAMUSCULAR | Status: AC
  Filled 2023-08-12: qty 30

## 2023-08-12 MED ORDER — OXYTOCIN-SODIUM CHLORIDE 30-0.9 UT/500ML-% IV SOLN
1.0000 m[IU]/min | INTRAVENOUS | Status: DC
  Administered 2023-08-12: 2 m[IU]/min via INTRAVENOUS
  Filled 2023-08-12 (×2): qty 500

## 2023-08-12 MED ORDER — EPHEDRINE 5 MG/ML INJ
10.0000 mg | INTRAVENOUS | Status: DC | PRN
Start: 2023-08-12 — End: 2023-08-13

## 2023-08-12 MED ORDER — CEFAZOLIN SODIUM-DEXTROSE 2-4 GM/100ML-% IV SOLN
INTRAVENOUS | Status: AC
  Filled 2023-08-12: qty 100

## 2023-08-12 MED ORDER — MISOPROSTOL 25 MCG QUARTER TABLET
25.0000 ug | ORAL_TABLET | ORAL | Status: DC
  Administered 2023-08-12: 25 ug via VAGINAL
  Filled 2023-08-12: qty 1

## 2023-08-12 MED ORDER — LIDOCAINE HCL (PF) 1 % IJ SOLN
INTRAMUSCULAR | Status: DC | PRN
  Administered 2023-08-12: 3 mL

## 2023-08-12 MED ORDER — ONDANSETRON HCL 4 MG/2ML IJ SOLN
4.0000 mg | Freq: Four times a day (QID) | INTRAMUSCULAR | Status: DC | PRN
  Administered 2023-08-12 (×2): 4 mg via INTRAVENOUS
  Filled 2023-08-12 (×2): qty 2

## 2023-08-12 MED ORDER — LACTATED RINGERS IV SOLN: 500.0000 mL | INTRAVENOUS | Status: DC | PRN

## 2023-08-12 MED ORDER — MISOPROSTOL 25 MCG QUARTER TABLET
25.0000 ug | ORAL_TABLET | ORAL | Status: DC
  Administered 2023-08-12: 25 ug via ORAL
  Filled 2023-08-12: qty 1

## 2023-08-12 MED ORDER — BUPIVACAINE HCL (PF) 0.5 % IJ SOLN
INTRAMUSCULAR | Status: AC
  Filled 2023-08-12: qty 60

## 2023-08-12 MED ORDER — LACTATED RINGERS IV SOLN: INTRAVENOUS | Status: DC

## 2023-08-12 MED ORDER — LIDOCAINE-EPINEPHRINE (PF) 1.5 %-1:200000 IJ SOLN
INTRAMUSCULAR | Status: DC | PRN
  Administered 2023-08-12 (×2): 3 mL via EPIDURAL

## 2023-08-12 MED ORDER — OXYTOCIN BOLUS FROM INFUSION
333.0000 mL | Freq: Once | INTRAVENOUS | Status: AC
  Administered 2023-08-12: 333 mL via INTRAVENOUS

## 2023-08-12 MED ORDER — SODIUM CHLORIDE 0.9 % IV SOLN
5.0000 10*6.[IU] | Freq: Once | INTRAVENOUS | Status: AC
  Administered 2023-08-12: 5 10*6.[IU] via INTRAVENOUS
  Filled 2023-08-12: qty 5

## 2023-08-12 MED ORDER — TERBUTALINE SULFATE 1 MG/ML IJ SOLN: 0.2500 mg | Freq: Once | INTRAMUSCULAR | Status: DC | PRN

## 2023-08-12 MED ORDER — AMMONIA AROMATIC IN INHA
RESPIRATORY_TRACT | Status: AC
  Filled 2023-08-12: qty 10

## 2023-08-12 MED ORDER — LIDOCAINE HCL (PF) 1 % IJ SOLN: 30.0000 mL | INTRAMUSCULAR | Status: DC | PRN

## 2023-08-12 MED ORDER — DIPHENHYDRAMINE HCL 50 MG/ML IJ SOLN: 12.5000 mg | INTRAMUSCULAR | Status: DC | PRN

## 2023-08-12 MED ORDER — SODIUM CHLORIDE 0.9 % IV SOLN
INTRAVENOUS | Status: AC
  Filled 2023-08-12: qty 5

## 2023-08-12 MED ORDER — TRANEXAMIC ACID-NACL 1000-0.7 MG/100ML-% IV SOLN
INTRAVENOUS | Status: AC
  Filled 2023-08-12: qty 100

## 2023-08-12 MED ORDER — LIDOCAINE HCL (PF) 1 % IJ SOLN: INTRAMUSCULAR | Status: DC | PRN

## 2023-08-12 MED ORDER — LACTATED RINGERS IV SOLN
500.0000 mL | Freq: Once | INTRAVENOUS | Status: AC
  Administered 2023-08-12: 500 mL via INTRAVENOUS

## 2023-08-12 MED ORDER — SOD CITRATE-CITRIC ACID 500-334 MG/5ML PO SOLN: 30.0000 mL | ORAL | Status: DC | PRN

## 2023-08-12 MED ORDER — FENTANYL-BUPIVACAINE-NACL 0.5-0.125-0.9 MG/250ML-% EP SOLN
12.0000 mL/h | EPIDURAL | Status: DC | PRN
  Administered 2023-08-12: 12 mL/h via EPIDURAL

## 2023-08-12 MED ORDER — OXYTOCIN 10 UNIT/ML IJ SOLN
INTRAMUSCULAR | Status: AC
  Filled 2023-08-12: qty 2

## 2023-08-12 MED ORDER — BUPIVACAINE HCL (PF) 0.25 % IJ SOLN
INTRAMUSCULAR | Status: DC | PRN
  Administered 2023-08-12: 8 mL via EPIDURAL
  Administered 2023-08-12: 4 mL via EPIDURAL
  Administered 2023-08-12: 3 mL via EPIDURAL
  Administered 2023-08-12: 8 mL via EPIDURAL

## 2023-08-12 MED ORDER — ACETAMINOPHEN 325 MG PO TABS
650.0000 mg | ORAL_TABLET | ORAL | Status: DC | PRN
  Administered 2023-08-13: 650 mg via ORAL
  Filled 2023-08-12: qty 2

## 2023-08-12 MED ORDER — PENICILLIN G POT IN DEXTROSE 60000 UNIT/ML IV SOLN
3.0000 10*6.[IU] | INTRAVENOUS | Status: DC
Start: 2023-08-12 — End: 2023-08-16
  Administered 2023-08-12 (×3): 3 10*6.[IU] via INTRAVENOUS
  Filled 2023-08-12 (×5): qty 50

## 2023-08-12 NOTE — Progress Notes (Signed)
L&D Note    Subjective:  Pain is better controlled, feeling like she's able to push more effectively   Objective:   Vitals:   08/12/23 1615 08/12/23 1715 08/12/23 1814 08/12/23 1941  BP: 102/62 110/65 126/80   Pulse: 72 72 96   Resp: 16     Temp: 98.5 F (36.9 C)  98.9 F (37.2 C) 99.3 F (37.4 C)  TempSrc: Oral  Oral Axillary  SpO2: 100% 99%    Weight:      Height:        Current Vital Signs 24h Vital Sign Ranges  T 99.3 F (37.4 C) Temp  Avg: 98.6 F (37 C)  Min: 98 F (36.7 C)  Max: 99.3 F (37.4 C)  BP 126/80 BP  Min: 96/48  Max: 134/74  HR 96 Pulse  Avg: 78.6  Min: 65  Max: 96  RR 16 Resp  Avg: 16.3  Min: 16  Max: 18  SaO2 99 % Room Air SpO2  Avg: 98.4 %  Min: 97 %  Max: 100 %      Gen: alert, cooperative, no distress FHR: Baseline: 120 bpm, Variability: moderate, Accels: Present, Decels: none Toco: regular, every 2-3 minutes SVE: Dilation: 10 Dilation Complete Date: 08/12/23 Dilation Complete Time: 1907 Effacement (%): 100 Cervical Position: Anterior Station: -1 Presentation: Vertex Exam by:: D Cox RN  Medications SCHEDULED MEDICATIONS   misoprostol  25 mcg Oral Q4H   And   misoprostol  25 mcg Vaginal Q4H   oxytocin 40 units in LR 1000 mL  333 mL Intravenous Once    MEDICATION INFUSIONS   fentaNYL 2 mcg/mL w/bupivacaine 0.125% in NS 250 mL 12 mL/hr (08/12/23 1453)   lactated ringers 999 mL/hr at 08/12/23 1438   lactated ringers 125 mL/hr at 08/12/23 0831   oxytocin     oxytocin Stopped (08/12/23 1435)   pencillin G potassium IV 3 Million Units (08/12/23 2024)    PRN MEDICATIONS  acetaminophen, diphenhydrAMINE, ePHEDrine, ePHEDrine, fentaNYL (SUBLIMAZE) injection, fentaNYL 2 mcg/mL w/bupivacaine 0.125% in NS 250 mL, lactated ringers, lidocaine (PF), ondansetron, phenylephrine, phenylephrine, sodium citrate-citric acid, terbutaline   Assessment & Plan:  37 y.o. G4P0030 at [redacted]w[redacted]d admitted for IOL d/t LGA and AMA  -Labor: 2nd stage of labor. Pushing  more effectively with contractions and descent noted over past 45 minutes -Fetal Well-being: Category I -GBS: positive - PCN x 4  -Membranes ruptured, clear fluid, artificially at 1340  -Continue present management. Discussed continued pushing since her pain is better controlled and descent noted over past 45 minutes. Will plan to reevaluate in 30 minutes.  -Analgesia: regional anesthesia -Dr. Feliberto Gottron notified of progress    Jo Perkins, Jo Perkins  08/12/2023 9:21 PM  [facility]

## 2023-08-12 NOTE — Anesthesia Procedure Notes (Signed)
Epidural Patient location during procedure: OB Start time: 08/12/2023 2:33 PM End time: 08/12/2023 2:50 PM  Staffing Anesthesiologist: Reed Breech, MD Resident/CRNA: Jeanine Luz, CRNA Performed: other anesthesia staff   Preanesthetic Checklist Completed: patient identified, IV checked, site marked, risks and benefits discussed, surgical consent, monitors and equipment checked, pre-op evaluation and timeout performed  Epidural Patient position: sitting Prep: ChloraPrep Patient monitoring: heart rate, continuous pulse ox and blood pressure Approach: midline Location: L3-L4 Injection technique: LOR saline  Needle:  Needle type: Tuohy  Needle gauge: 17 G Needle length: 9 cm Needle insertion depth: 6.5 cm Catheter type: closed end flexible Catheter size: 19 Gauge Catheter at skin depth: 11 cm Test dose: negative and 1.5% lidocaine with Epi 1:200 K  Assessment Sensory level: T10 Events: blood not aspirated, no cerebrospinal fluid, injection not painful, no injection resistance, no paresthesia and negative IV test  Additional Notes 1 attempt Pt. Evaluated and documentation done after procedure finished. Patient identified. Risks/Benefits/Options discussed with patient including but not limited to bleeding, infection, nerve damage, paralysis, failed block, incomplete pain control, headache, blood pressure changes, nausea, vomiting, reactions to medication both or allergic, itching and postpartum back pain. Confirmed with bedside nurse the patient's most recent platelet count. Confirmed with patient that they are not currently taking any anticoagulation, have any bleeding history or any family history of bleeding disorders. Patient expressed understanding and wished to proceed. All questions were answered. Sterile technique was used throughout the entire procedure. Please see nursing notes for vital signs. Test dose was given through epidural catheter and negative prior to  continuing to dose epidural or start infusion. Warning signs of high block given to the patient including shortness of breath, tingling/numbness in hands, complete motor block, or any concerning symptoms with instructions to call for help. Patient was given instructions on fall risk and not to get out of bed. All questions and concerns addressed with instructions to call with any issues or inadequate analgesia.    Patient tolerated the insertion well without immediate complications.Reason for block:procedure for pain

## 2023-08-12 NOTE — Progress Notes (Signed)
L&D Note    Subjective:  More comfortable since epidural was replaced   Objective:   Vitals:   08/12/23 1600 08/12/23 1615 08/12/23 1715 08/12/23 1814  BP: (!) 103/57 102/62 110/65 126/80  Pulse: 69 72 72 96  Resp:  16    Temp:  98.5 F (36.9 C)  98.9 F (37.2 C)  TempSrc:  Oral  Oral  SpO2: 98% 100% 99%   Weight:      Height:        Current Vital Signs 24h Vital Sign Ranges  T 98.9 F (37.2 C) Temp  Avg: 98.5 F (36.9 C)  Min: 98 F (36.7 C)  Max: 98.9 F (37.2 C)  BP 126/80 BP  Min: 96/48  Max: 134/74  HR 96 Pulse  Avg: 78.6  Min: 65  Max: 96  RR 16 Resp  Avg: 16.3  Min: 16  Max: 18  SaO2 99 % Room Air SpO2  Avg: 98.4 %  Min: 97 %  Max: 100 %      Gen: alert, cooperative, no distress FHR: Baseline: 120 bpm, Variability: moderate, Accels: Present, Decels: early Toco: regular, every 2-4 minutes SVE: Dilation: 9 Effacement (%): 90 Cervical Position: Anterior Station: Plus 1 Presentation: Vertex Exam by:: Carlis Burnsworth CNM  Medications SCHEDULED MEDICATIONS   misoprostol  25 mcg Oral Q4H   And   misoprostol  25 mcg Vaginal Q4H   oxytocin 40 units in LR 1000 mL  333 mL Intravenous Once    MEDICATION INFUSIONS   fentaNYL 2 mcg/mL w/bupivacaine 0.125% in NS 250 mL 12 mL/hr (08/12/23 1453)   lactated ringers 999 mL/hr at 08/12/23 1438   lactated ringers 125 mL/hr at 08/12/23 0831   oxytocin     oxytocin Stopped (08/12/23 1435)   pencillin G potassium IV 3 Million Units (08/12/23 1622)    PRN MEDICATIONS  acetaminophen, diphenhydrAMINE, ePHEDrine, ePHEDrine, fentaNYL (SUBLIMAZE) injection, fentaNYL 2 mcg/mL w/bupivacaine 0.125% in NS 250 mL, lactated ringers, lidocaine (PF), ondansetron, phenylephrine, phenylephrine, sodium citrate-citric acid, terbutaline   Assessment & Plan:  37 y.o. G4P0030 at [redacted]w[redacted]d admitted for IOL d/t LGA and AMA -Labor: Active phase labor. Adequate change since AROM. Oxytocin discontinued d/t frequent contractions, was previously on 2  milliunits/min -Fetal Well-being: Category I -GBS: positive - PCN x 3 doses  -Membranes ruptured, clear fluid, artificially at 1340  -Continue present management. -Analgesia: regional anesthesia -Dr. Feliberto Gottron updated on progress   Gustavo Lah, CNM  08/12/2023 6:28 PM  [facility]

## 2023-08-12 NOTE — Progress Notes (Signed)
L&D Note    Subjective:  Feeling mild to moderate cramping   Objective:   Vitals:   08/12/23 0118 08/12/23 0300 08/12/23 0535 08/12/23 0706  BP: 127/81 121/84 (!) 103/56 122/68  Pulse: 84 89 74 76  Resp:  16 16 18   Temp:  98.7 F (37.1 C) 98.2 F (36.8 C) 98 F (36.7 C)  TempSrc:  Oral Oral Oral  SpO2:        Current Vital Signs 24h Vital Sign Ranges  T 98 F (36.7 C) Temp  Avg: 98.5 F (36.9 C)  Min: 98 F (36.7 C)  Max: 98.7 F (37.1 C)  BP 122/68 BP  Min: 103/56  Max: 129/87  HR 76 Pulse  Avg: 80.8  Min: 74  Max: 89  RR 18 Resp  Avg: 16.4  Min: 16  Max: 18  SaO2 99 % Room Air SpO2  Avg: 99 %  Min: 99 %  Max: 99 %      Gen: alert, cooperative, no distress FHR: Baseline: 130 bpm, Variability: moderate, Accels: Present, Decels: none Toco: regular, every 2-3 minutes, mild to palpation  SVE: Dilation: 3 Effacement (%): 50 Cervical Position: Anterior Station: -2 Presentation: Vertex Exam by:: JDaley  Medications SCHEDULED MEDICATIONS   ammonia       lidocaine (PF)       misoprostol       misoprostol  25 mcg Oral Q4H   And   misoprostol  25 mcg Vaginal Q4H   oxytocin       oxytocin 40 units in LR 1000 mL  333 mL Intravenous Once    MEDICATION INFUSIONS   lactated ringers     lactated ringers 125 mL/hr at 08/12/23 0541   oxytocin     oxytocin     pencillin G potassium IV     Followed by   pencillin G potassium IV      PRN MEDICATIONS  acetaminophen, ammonia, fentaNYL (SUBLIMAZE) injection, lactated ringers, lidocaine (PF), lidocaine (PF), misoprostol, ondansetron, oxytocin, sodium citrate-citric acid, terbutaline   Assessment & Plan:  37 y.o. G4P0030 at [redacted]w[redacted]d admitted for IOL d/t macrosomia, polyhydramnios (resolved), AMA -Labor: s/p cervical ripening with misoprostol. Will start oxytocin and titrate for labor and appropriate maternal/fetal response. Currently contracting every 2-3 minutes but mild to palpation, not causing cervical change, Cat 1 tracing,  and Sunny reports cramping.  -Fetal Well-being: Category I -GBS: positive - will start GBS prophylaxis with PCN  -Membranes intact -Intervention: IV Pitocin induction, will consider AROM after GBS prophylaxis  -Analgesia: regional anesthesia when desired    Gustavo Lah, CNM  08/12/2023 8:17 AM  [facility]

## 2023-08-12 NOTE — Anesthesia Preprocedure Evaluation (Addendum)
Anesthesia Evaluation  Patient identified by MRN, date of birth, ID band Patient awake    Reviewed: Allergy & Precautions, NPO status , Patient's Chart, lab work & pertinent test results  History of Anesthesia Complications Negative for: history of anesthetic complications  Airway Mallampati: III   Neck ROM: Full    Dental   Pulmonary neg pulmonary ROS   Pulmonary exam normal breath sounds clear to auscultation       Cardiovascular Exercise Tolerance: Good Normal cardiovascular exam Rhythm:Regular Rate:Normal  Echo 04/27/22:  NORMAL LEFT VENTRICULAR SYSTOLIC FUNCTION  NORMAL RIGHT VENTRICULAR SYSTOLIC FUNCTION  TRIVIAL REGURGITATION NOTED  NO VALVULAR STENOSIS  ESTIMATED LVEF >55%  AORTIC VALVE TRICUSPID WITH FULLY FUNCTIONING LEAFLETS    Neuro/Psych negative neurological ROS     GI/Hepatic Ulcerative colitis   Endo/Other  negative endocrine ROS    Renal/GU negative Renal ROS     Musculoskeletal   Abdominal   Peds  Hematology  (+) Blood dyscrasia, anemia   Anesthesia Other Findings 37 yo G4P0030 at 45 2/7 requesting labor epidural.  Reproductive/Obstetrics Uterine fibroids                             Anesthesia Physical Anesthesia Plan  ASA: 2  Anesthesia Plan: Epidural   Post-op Pain Management:    Induction:   PONV Risk Score and Plan: 2 and Treatment may vary due to age or medical condition  Airway Management Planned: Natural Airway  Additional Equipment:   Intra-op Plan:   Post-operative Plan:   Informed Consent: I have reviewed the patients History and Physical, chart, labs and discussed the procedure including the risks, benefits and alternatives for the proposed anesthesia with the patient or authorized representative who has indicated his/her understanding and acceptance.     Dental Advisory Given  Plan Discussed with:   Anesthesia Plan Comments: (Patient  reports no bleeding problems and no anticoagulant use.   Patient consented for risks of anesthesia including but not limited to:  - adverse reactions to medications - risk of bleeding, infection and or nerve damage from epidural that could lead to paralysis - risk of headache or failed epidural - nerve damage due to positioning - that if epidural is used for C-section that there is a chance of epidural failure requiring spinal placement or conversion to GA - damage to heart, brain, lungs, other parts of body or loss of life  Patient voiced understanding and assent.)        Anesthesia Quick Evaluation

## 2023-08-12 NOTE — Progress Notes (Signed)
L&D Note    Subjective:  Pushing with contractions. Has breakthrough pain in LLQ.  Feels intense pressure and having breakthrough pain with contractions.   Objective:   Vitals:   08/12/23 1615 08/12/23 1715 08/12/23 1814 08/12/23 1941  BP: 102/62 110/65 126/80   Pulse: 72 72 96   Resp: 16     Temp: 98.5 F (36.9 C)  98.9 F (37.2 C) 99.3 F (37.4 C)  TempSrc: Oral  Oral Axillary  SpO2: 100% 99%    Weight:      Height:        Current Vital Signs 24h Vital Sign Ranges  T 99.3 F (37.4 C) Temp  Avg: 98.6 F (37 C)  Min: 98 F (36.7 C)  Max: 99.3 F (37.4 C)  BP 126/80 BP  Min: 96/48  Max: 134/74  HR 96 Pulse  Avg: 78.6  Min: 65  Max: 96  RR 16 Resp  Avg: 16.3  Min: 16  Max: 18  SaO2 99 % Room Air SpO2  Avg: 98.4 %  Min: 97 %  Max: 100 %      Gen: alert, cooperative, no distress FHR: Baseline: 130 bpm, Variability: moderate, Accels: Present, Decels: variable and early Toco: regular, every 2-3 minutes SVE: Dilation: 10 Dilation Complete Date: 08/12/23 Dilation Complete Time: 1907 Effacement (%): 100 Cervical Position: Anterior Station: -1 Presentation: Vertex Exam by:: D Cox RN  Medications SCHEDULED MEDICATIONS   misoprostol  25 mcg Oral Q4H   And   misoprostol  25 mcg Vaginal Q4H   oxytocin 40 units in LR 1000 mL  333 mL Intravenous Once    MEDICATION INFUSIONS   fentaNYL 2 mcg/mL w/bupivacaine 0.125% in NS 250 mL 12 mL/hr (08/12/23 1453)   lactated ringers 999 mL/hr at 08/12/23 1438   lactated ringers 125 mL/hr at 08/12/23 0831   oxytocin     oxytocin Stopped (08/12/23 1435)   pencillin G potassium IV 3 Million Units (08/12/23 2024)    PRN MEDICATIONS  acetaminophen, diphenhydrAMINE, ePHEDrine, ePHEDrine, fentaNYL (SUBLIMAZE) injection, fentaNYL 2 mcg/mL w/bupivacaine 0.125% in NS 250 mL, lactated ringers, lidocaine (PF), ondansetron, phenylephrine, phenylephrine, sodium citrate-citric acid, terbutaline   Assessment & Plan:  37 y.o. G4P0030 at [redacted]w[redacted]d  admitted for IOL d/t LGA and AMA  -Labor: 2nd stage of labor. Pushing started at 1920 -Fetal Well-being: Category II - overall reassuring with moderate variability  -GBS: positive PCN x 3 doses given - 4th dose given now  -Membranes ruptured, clear fluid, artificially at 1340  -Intervention: change maternal position. Utilizing a variety of pushing positions. In Hands and knees then back to right lateral. Now semi-sitting with right tilt.  Pt reports feeling more rectal pressure since changing to semi-sitting.  -Analgesia: regional anesthesia   Gustavo Lah, CNM  08/12/2023 8:38 PM  [facility]

## 2023-08-12 NOTE — Anesthesia Procedure Notes (Signed)
Epidural Patient location during procedure: OB Start time: 08/12/2023 10:44 AM End time: 08/12/2023 10:52 AM  Staffing Anesthesiologist: Reed Breech, MD Performed: anesthesiologist   Preanesthetic Checklist Completed: patient identified, IV checked, risks and benefits discussed, surgical consent, monitors and equipment checked, pre-op evaluation and timeout performed  Epidural Patient position: sitting Prep: Betadine Patient monitoring: heart rate, continuous pulse ox and blood pressure Approach: midline Location: L3-L4 Injection technique: LOR air  Needle:  Needle type: Tuohy  Needle gauge: 17 G Needle length: 9 cm Needle insertion depth: 6.5 cm Catheter at skin depth: 11.5 cm Test dose: negative and 1.5% lidocaine with Epi 1:200 K  Assessment Sensory level: T4  Additional Notes Straightforward placement without apparent complications. Reason for block:procedure for pain

## 2023-08-12 NOTE — Discharge Instructions (Signed)
Vaginal Delivery, Care After Refer to this sheet in the next few weeks. These discharge instructions provide you with information on caring for yourself after delivery. Your caregiver may also give you specific instructions. Your treatment has been planned according to the most current medical practices available, but problems sometimes occur. Call your caregiver if you have any problems or questions after you go home. HOME CARE INSTRUCTIONS Take over-the-counter or prescription medicines only as directed by your caregiver or pharmacist. Do not drink alcohol, especially if you are breastfeeding or taking medicine to relieve pain. Do not smoke tobacco. Continue to use good perineal care. Good perineal care includes: Wiping your perineum from back to front Keeping your perineum clean. You can do sitz baths twice a day, to help keep this area clean Do not use tampons, douche or have sex until your caregiver says it is okay. Shower only and avoid sitting in submerged water, aside from sitz baths Wear a well-fitting bra that provides breast support. Eat healthy foods. Drink enough fluids to keep your urine clear or pale yellow. Eat high-fiber foods such as whole grain cereals and breads, brown rice, beans, and fresh fruits and vegetables every day. These foods may help prevent or relieve constipation. Avoid constipation with high fiber foods or medications, such as miralax or metamucil Follow your caregiver's recommendations regarding resumption of activities such as climbing stairs, driving, lifting, exercising, or traveling. Talk to your caregiver about resuming sexual activities. Resumption of sexual activities is dependent upon your risk of infection, your rate of healing, and your comfort and desire to resume sexual activity. Try to have someone help you with your household activities and your newborn for at least a few days after you leave the hospital. Rest as much as possible. Try to rest or  take a nap when your newborn is sleeping. Increase your activities gradually. Keep all of your scheduled postpartum appointments. It is very important to keep your scheduled follow-up appointments. At these appointments, your caregiver will be checking to make sure that you are healing physically and emotionally. SEEK MEDICAL CARE IF:  You are passing large clots from your vagina. Save any clots to show your caregiver. You have a foul smelling discharge from your vagina. You have trouble urinating. You are urinating frequently. You have pain when you urinate. You have a change in your bowel movements. You have increasing redness, pain, or swelling near your vaginal incision (episiotomy) or vaginal tear. You have pus draining from your episiotomy or vaginal tear. Your episiotomy or vaginal tear is separating. You have painful, hard, or reddened breasts. You have a severe headache. You have blurred vision or see spots. You feel sad or depressed. You have thoughts of hurting yourself or your newborn. You have questions about your care, the care of your newborn, or medicines. You are dizzy or light-headed. You have a rash. You have nausea or vomiting. You were breastfeeding and have not had a menstrual period within 12 weeks after you stopped breastfeeding. You are not breastfeeding and have not had a menstrual period by the 12th week after delivery. You have a fever. SEEK IMMEDIATE MEDICAL CARE IF:  You have persistent pain. You have chest pain. You have shortness of breath. You faint. You have leg pain. You have stomach pain. Your vaginal bleeding saturates two or more sanitary pads in 1 hour. MAKE SURE YOU:  Understand these instructions. Will watch your condition. Will get help right away if you are not doing well or  get worse. Document Released: 08/31/2000 Document Revised: 01/18/2014 Document Reviewed: 04/30/2012 [facility] Patient Information 2015 [place], [place]. This  information is not intended to replace advice given to you by your health care provider. Make sure you discuss any questions you have with your health care provider.  Sitz Bath A sitz bath is a warm water bath taken in the sitting position. The water covers only the hips and butt (buttocks). We recommend using one that fits in the toilet, to help with ease of use and cleanliness. It may be used for either healing or cleaning purposes. Sitz baths are also used to relieve pain, itching, or muscle tightening (spasms). The water may contain medicine. Moist heat will help you heal and relax.  HOME CARE  Take 3 to 4 sitz baths a day. Fill the bathtub half-full with warm water. Sit in the water and open the drain a little. Turn on the warm water to keep the tub half-full. Keep the water running constantly. Soak in the water for 15 to 20 minutes. After the sitz bath, pat the affected area dry. GET HELP RIGHT AWAY IF: You get worse instead of better. Stop the sitz baths if you get worse. MAKE SURE YOU: Understand these instructions. Will watch your condition. Will get help right away if you are not doing well or get worse. Document Released: 10/11/2004 Document Revised: 05/28/2012 Document Reviewed: 01/01/2011 [facility] Patient Information 2015 [place], [place]. This information is not intended to replace advice given to you by your health care provider. Make sure you discuss any questions you have with your health care provider.

## 2023-08-12 NOTE — H&P (Signed)
OB History & Physical   History of Present Illness:  Chief Complaint:   HPI:  JEANNIA HEINZELMAN is a 37 y.o. G49P0030 female at [redacted]w[redacted]d dated by LMP c/w [redacted]w[redacted]d [place].  She presents to L&D for scheduled IOL for AMA, LGA baby, and ulcerative colitis.   Pregnancy Issues: LGA (4136g on 07/24/23) Ulcerative colitis AMA Uterine fibroids GBS positive Anemia Polyhydramnios   Maternal Medical History:   Past Medical History:  Diagnosis Date   Acute pancreatitis 05/05/2016   Depression    History of colon polyps    Pancreatitis, acute    Panic attack    Ulcerative colitis (HCC)     Past Surgical History:  Procedure Laterality Date   COLONOSCOPY     DILATION AND CURETTAGE OF UTERUS N/A 07/07/2021   Procedure: SUCTION DILATATION AND CURETTAGE;  Surgeon: Suzy Bouchard, MD;  Location: [facility] ORS;  Service: Gynecology;  Laterality: N/A;   none      Allergies  Allergen Reactions   Imuran  [Azathioprine]     caused pancreatitis    Prior to Admission medications   Medication Sig Start Date End Date Taking? Authorizing Provider  aspirin EC 81 MG tablet Take 81 mg by mouth daily. Swallow whole.    [provider]  Prenatal Vit-Fe Fumarate-FA (MULTIVITAMIN-PRENATAL) 27-0.8 MG TABS tablet Take 1 tablet by mouth daily at 12 noon.    [provider]  sertraline (ZOLOFT) 100 MG tablet Take 100 mg by mouth at bedtime.    [provider]    Prenatal care site: [facility]   Social History: She  reports that she has never smoked. She has never used smokeless tobacco. She reports that she does not currently use alcohol. She reports that she does not use drugs.  Family History: family history includes Diabetes in her maternal grandmother; Heart attack in her maternal grandmother; Lung cancer in her maternal grandfather; Stroke in her maternal grandmother.   Review of Systems: A full review of systems was performed and negative except as noted in the HPI.      Physical Exam:  Vital Signs: BP (!) 103/56   Pulse 74   Temp 98.2 F (36.8 C) (Oral)   Resp 16   SpO2 99%  General: no acute distress.  HEENT: normocephalic, atraumatic Heart: regular rate & rhythm.  No murmurs/rubs/gallops Lungs: clear to auscultation bilaterally, normal respiratory effort Abdomen: soft, gravid, non-tender;  EFW: 9.5lb Pelvic:   External: Normal external female genitalia  Cervix: Dilation: 3 / Effacement (%): 50 / Station: -2    Extremities: non-tender, symmetric, mild edema bilaterally.  DTRs: +2  Neurologic: Alert & oriented x 3.    Results for orders placed or performed during the hospital encounter of 08/12/23 (from the past 24 hour(s))  CBC     Status: Abnormal   Collection Time: 08/12/23  1:12 AM  Result Value Ref Range   WBC 11.7 (H) 4.0 - 10.5 K/uL   RBC 3.77 (L) 3.87 - 5.11 MIL/uL   Hemoglobin 10.6 (L) 12.0 - 15.0 g/dL   HCT 86.5 (L) 78.4 - 69.6 %   MCV 83.0 80.0 - 100.0 fL   MCH 28.1 26.0 - 34.0 pg   MCHC 33.9 30.0 - 36.0 g/dL   RDW 29.5 28.4 - 13.2 %   Platelets 232 150 - 400 K/uL   nRBC 0.0 0.0 - 0.2 %  Type and screen     Status: None   Collection Time: 08/12/23  1:12 AM  Result Value Ref Range   ABO/RH(D) A POS    Antibody Screen NEG    Sample Expiration      08/15/2023,2359 Performed at [facility], [address] [place], [place] [address]   Comprehensive metabolic panel     Status: Abnormal   Collection Time: 08/12/23  1:12 AM  Result Value Ref Range   Sodium 129 (L) 135 - 145 mmol/L   Potassium 3.4 (L) 3.5 - 5.1 mmol/L   Chloride 100 98 - 111 mmol/L   CO2 18 (L) 22 - 32 mmol/L   Glucose, Bld 117 (H) 70 - 99 mg/dL   BUN 10 6 - 20 mg/dL   Creatinine, Ser 5.40 0.44 - 1.00 mg/dL   Calcium 8.6 (L) 8.9 - 10.3 mg/dL   Total Protein 6.7 6.5 - 8.1 g/dL   Albumin 2.6 (L) 3.5 - 5.0 g/dL   AST 19 15 - 41 U/L   ALT 9 0 - 44 U/L   Alkaline Phosphatase 136 (H) 38 - 126 U/L   Total Bilirubin 0.5 <1.2 mg/dL   GFR,  Estimated >98 >11 mL/min   Anion gap 11 5 - 15  Protein / creatinine ratio, urine     Status: Abnormal   Collection Time: 08/12/23  1:12 AM  Result Value Ref Range   Creatinine, Urine 115 mg/dL   Total Protein, Urine 24 mg/dL   Protein Creatinine Ratio 0.21 (H) 0.00 - 0.15 mg/mg[Cre]    Pertinent Results:  Prenatal Labs: Blood type/Rh A pos  Antibody screen neg  Rubella Immune  Varicella Immune  RPR NR  HBsAg Neg  HIV NR  GC neg  Chlamydia neg  Genetic screening negative  1 hour GTT 126  3 hour GTT   GBS Positive   FHT: 140bpm, moderate variability, accelerations present, no decelerations TOCO: contractions q1-56min, last 30-60 seconds SVE:  Dilation: 3 / Effacement (%): 50 / Station: -2    Cephalic by leopolds  No results found.  Assessment:  SHAMYAH SOBERS is a 37 y.o. G80P0030 female at [redacted]w[redacted]d with IOL for AMA, LGA, and ulcerative colitis.   Plan:  1. Admit to Labor & Delivery; consents reviewed and obtained  2. Fetal Well being  - Fetal Tracing: Category I tracing - Group B Streptococcus ppx indicated: will treat with active labor or SROM - Presentation: vertex confirmed by SVE   3. Routine OB: - Prenatal labs reviewed, as above - Rh positive - CBC, T&S, RPR on admit - Clear fluids, saline lock  4. Induction of Labor -  Contractions q1-24min, external toco in place -  Pelvis adequate for trial of labor -  Plan for induction with cytotec, pitocin, AROM -  Plan for continuous fetal monitoring  -  Maternal pain control as desired; requesting regional anesthesia - Anticipate vaginal delivery  5. Post Partum Planning: - Infant feeding: formula feeding - Contraception: condoms - Tdap: received AP - Flu: received AP - RSV: received AP  Janyce Llanos, CNM 08/12/23 6:45 AM

## 2023-08-12 NOTE — Progress Notes (Signed)
L&D Note    Subjective:  More comfortable with epidural  Objective:   Vitals:   08/12/23 1155 08/12/23 1159 08/12/23 1200 08/12/23 1205  BP:  105/63  (!) 101/53  Pulse:  72  65  Resp:      Temp:      TempSrc:      SpO2: 97%  98% 98%  Weight:      Height:        Current Vital Signs 24h Vital Sign Ranges  T 98 F (36.7 C) Temp  Avg: 98.5 F (36.9 C)  Min: 98 F (36.7 C)  Max: 98.7 F (37.1 C)  BP (!) 101/53 BP  Min: 101/53  Max: 134/74  HR 65 Pulse  Avg: 77.3  Min: 65  Max: 89  RR 18 Resp  Avg: 16.4  Min: 16  Max: 18  SaO2 98 % Room Air SpO2  Avg: 98 %  Min: 97 %  Max: 100 %      Gen: alert, cooperative, no distress FHR: Baseline: 130 bpm, Variability: moderate, Accels: Present, Decels: none Toco: regular, every 2-3 minutes SVE: Dilation: 5.5 Effacement (%): 70 Cervical Position: Anterior Station: -2 Presentation: Vertex Exam by:: Norleen Xie CNM  Medications SCHEDULED MEDICATIONS   misoprostol  25 mcg Oral Q4H   And   misoprostol  25 mcg Vaginal Q4H   oxytocin 40 units in LR 1000 mL  333 mL Intravenous Once    MEDICATION INFUSIONS   fentaNYL 2 mcg/mL w/bupivacaine 0.125% in NS 250 mL 12 mL/hr (08/12/23 1055)   lactated ringers 999 mL/hr at 08/12/23 1204   lactated ringers 125 mL/hr at 08/12/23 0831   oxytocin     oxytocin Stopped (08/12/23 1115)   pencillin G potassium IV 3 Million Units (08/12/23 1210)    PRN MEDICATIONS  acetaminophen, diphenhydrAMINE, ePHEDrine, ePHEDrine, fentaNYL (SUBLIMAZE) injection, fentaNYL 2 mcg/mL w/bupivacaine 0.125% in NS 250 mL, lactated ringers, lidocaine (PF), ondansetron, phenylephrine, phenylephrine, sodium citrate-citric acid, terbutaline   Assessment & Plan:  37 y.o. G4P0030 at [redacted]w[redacted]d admitted for IOL d/t LGA and 37 y.o. G4P0030  -Labor: s/p cervical ripening with misoprostol, adequate cervical change, oxytocin infusing for continued active management of induction  -Fetal Well-being: Category I -GBS: positive - PCN x 2 given  -Membranes  ruptured, clear fluid, artificially at 1340 -Continue present management. -Analgesia: regional anesthesia -Dr. Feliberto Gottron updated on progress   Gustavo Lah, CNM  08/12/2023 1:47 PM  [facility]

## 2023-08-12 NOTE — Progress Notes (Signed)
Patient ID: Jo Perkins, female   DOB: 06/06/1986, 37 y.o.   MRN: [id number] Pt pushing now 3 hours and I was asked to evaluate the pt my CNM Mackie .  Cx c/c+3/3 station OT position. Adequate room for  delivery .  I have counseled the patient for the indication and potential risks of fetal injury . Pt and couple understand and agree to the procedure .

## 2023-08-12 NOTE — Progress Notes (Signed)
L&D Note    Subjective:  Pushing well with contractions   Objective:   Vitals:   08/12/23 1715 08/12/23 1814 08/12/23 1941 08/12/23 2128  BP: 110/65 126/80    Pulse: 72 96    Resp:      Temp:  98.9 F (37.2 C) 99.3 F (37.4 C) 98.9 F (37.2 C)  TempSrc:  Oral Axillary Axillary  SpO2: 99%     Weight:      Height:        Current Vital Signs 24h Vital Sign Ranges  T 98.9 F (37.2 C) Temp  Avg: 98.6 F (37 C)  Min: 98 F (36.7 C)  Max: 99.3 F (37.4 C)  BP 126/80 BP  Min: 96/48  Max: 134/74  HR 96 Pulse  Avg: 78.6  Min: 65  Max: 96  RR 16 Resp  Avg: 16.3  Min: 16  Max: 18  SaO2 99 % Room Air SpO2  Avg: 98.4 %  Min: 97 %  Max: 100 %      Gen: alert, cooperative, no distress FHR: Baseline: 120 bpm, Variability: moderate, Accels: Present, Decels: none Toco: regular, every 2-3 minutes SVE: Dilation: 10 Dilation Complete Date: 08/12/23 Dilation Complete Time: 1907 Effacement (%): 100 Cervical Position: Anterior Station: -1 Presentation: Vertex Exam by:: D Cox RN  Medications SCHEDULED MEDICATIONS   misoprostol  25 mcg Oral Q4H   And   misoprostol  25 mcg Vaginal Q4H   oxytocin 40 units in LR 1000 mL  333 mL Intravenous Once    MEDICATION INFUSIONS   fentaNYL 2 mcg/mL w/bupivacaine 0.125% in NS 250 mL 12 mL/hr (08/12/23 1453)   lactated ringers 999 mL/hr at 08/12/23 1438   lactated ringers 125 mL/hr at 08/12/23 0831   oxytocin     oxytocin Stopped (08/12/23 1435)   pencillin G potassium IV 3 Million Units (08/12/23 2024)    PRN MEDICATIONS  acetaminophen, diphenhydrAMINE, ePHEDrine, ePHEDrine, fentaNYL (SUBLIMAZE) injection, fentaNYL 2 mcg/mL w/bupivacaine 0.125% in NS 250 mL, lactated ringers, lidocaine (PF), ondansetron, phenylephrine, phenylephrine, sodium citrate-citric acid, terbutaline   Assessment & Plan:  37 y.o. G4P0030 at [redacted]w[redacted]d admitted for IOL d/t AMA and LGA -Labor: 2nd stage of labor - pushing for <2 1/2 hours, slow descent with good maternal effort   -Fetal Well-being: Category I -GBS: positive - PCN x 4 doses  -Membranes ruptured, clear fluid, artificially at 1340  -I briefly discussed options for continued pushing, assisted delivery with foceps or vacuum (only if appropriate), or primary cesarean section. Reviewed that I recommend Dr. Georgeanne Nim to evaluate. At this time Mersaydes and her partner do not feel strongly about either option. They would like to discuss with Dr. Feliberto Gottron and then make a decision on how to proceed.  -Dr. Feliberto Gottron called to come evaluate.  -Analgesia: regional anesthesia   Gustavo Lah, CNM  08/12/2023 10:05 PM  [facility]

## 2023-08-12 NOTE — Discharge Summary (Addendum)
Postpartum Discharge Summary  Patient Name: Jo Perkins DOB: 03/03/1986 MRN: [id number]  Date of admission: 08/12/2023 Delivery date:08/12/2023 Delivering provider: Suzy Bouchard Date of discharge: 08/14/2023  Primary OB: [facility] LMP:No LMP recorded. 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 yo [phone] at [redacted]w[redacted]d was admitted to the hospital 08/12/2023 for induction of labor.  Indication for induction:  LGA and AMA .  Patient had an labor course complicated by prolonged 2nd stage. Dr. Feliberto Gottron was requested to evaluate after 2 1/2 hours of pushing with slow descent despite good maternal effort. Please see delivery note for further details.  Membrane Rupture Time/Date: 1:40 PM,08/12/2023  Delivery Method:Vaginal, Forceps Operative Delivery:Device used:[place] Forceps Indication: Prolonged second stage Episiotomy: None Lacerations: left vaginal side wall and 2nd degree laceration  Details of delivery can be found in separate delivery note.  Patient had an uncomplicated postpartum course. Patient is discharged home 08/14/23.  Newborn Data: Birth date:08/12/2023 Birth time:10:47 PM Gender:Female Living status:Living Apgars:3 ,8  Weight:4430 g                                             Post partum procedures: None  Induction:: AROM, Pitocin, and Cytotec Complications: None Delivery Type:  forceps assisted vaginal delivery Anesthesia: epidural anesthesia Placenta: spontaneous To Pathology: No   Prenatal Labs:  Blood type/Rh A pos  Antibody screen neg  Rubella Immune  Varicella Immune  RPR NR  HBsAg Neg  HIV NR  GC neg  Chlamydia neg  Genetic screening negative  1 hour GTT 126  3 hour GTT    GBS Positive    Magnesium Sulfate received: No BMZ received: No Rhophylac:was not indicated MMR: was not indicated Varivax vaccine given: was not indicated T-DaP:Given prenatally Flu: Given prenatally  Transfusion:No  Physical exam  Vitals:   08/13/23 1220 08/13/23 1700 08/13/23 2320 08/14/23 0930  BP: 115/86 120/82 129/82 132/74  Pulse: 90 89 86 77  Resp: 20 18 20 20   Temp: 97.7 F (36.5 C) 98 F (36.7 C) 98 F (36.7 C) 98.7 F (37.1 C)  TempSrc: Oral Oral Oral Oral  SpO2: 100% 99% 97% 98%  Weight:      Height:       General: alert, cooperative, and no distress Lochia: appropriate Uterine Fundus: firm Perineum:minimal edema/repair well approximated DVT Evaluation: No evidence of DVT seen on physical exam.  Labs: Lab Results  Component Value Date   WBC 16.9 (H) 08/14/2023   HGB 8.8 (L) 08/14/2023   HCT 26.1 (L) 08/14/2023   MCV 83.4 08/14/2023   PLT 239 08/14/2023      Latest Ref Rng & Units 08/12/2023    1:12 AM  CMP  Glucose 70 - 99  mg/dL 161   BUN 6 - 20 mg/dL 10   Creatinine 0.96 - 1.00 mg/dL 0.45   Sodium 409 - 811 mmol/L 129   Potassium 3.5 - 5.1 mmol/L 3.4   Chloride 98 - 111 mmol/L 100   CO2 22 - 32 mmol/L 18   Calcium 8.9 - 10.3 mg/dL 8.6   Total Protein 6.5 - 8.1 g/dL 6.7   Total Bilirubin <9.1 mg/dL 0.5   Alkaline Phos 38 - 126 U/L 136   AST 15 - 41 U/L 19   ALT 0 - 44 U/L 9    Edinburgh Score:    08/13/2023    7:42 AM  Edinburgh Postnatal Depression Scale Screening Tool  I have been able to laugh and see the  funny side of things. 0  I have looked forward with enjoyment to things. 0  I have blamed myself unnecessarily when things went wrong. 1  I have been anxious or worried for no good reason. 0  I have felt scared or panicky for no good reason. 3  Things have been getting on top of me. 0  I have been so unhappy that I have had difficulty sleeping. 1  I have felt sad or miserable. 0  I have been so unhappy that I have been crying. 0  The thought of harming myself has occurred to me. 0  Edinburgh Postnatal Depression Scale Total 5    Risk assessment for postpartum VTE and prophylactic treatment: Very high risk factors: None High risk factors: None Moderate risk factors: BMI 30-40 kg/m2  Postpartum VTE prophylaxis with LMWH not indicated  After visit meds:  Allergies as of 08/14/2023       Reactions   Imuran  [azathioprine]    caused pancreatitis        Medication List     TAKE these medications    benzocaine-Menthol 20-0.5 % Aero Commonly known as: DERMOPLAST Apply 1 Application topically as needed for irritation (perineal discomfort).   coconut oil Oil Apply 1 Application topically as needed.   dibucaine 1 % Oint Commonly known as: NUPERCAINAL Place 1 Application rectally as needed for hemorrhoids.   ferrous sulfate 325 (65 FE) MG tablet Take 1 tablet (325 mg total) by mouth 2 (two) times daily with a meal.   multivitamin-prenatal 27-0.8 MG Tabs tablet Take 1 tablet by mouth daily at 12 noon.   sertraline 100 MG tablet Commonly known as: ZOLOFT Take 100 mg by mouth at bedtime.   witch hazel-glycerin pad Commonly known as: TUCKS Apply 1 Application topically as needed for hemorrhoids.       Discharge home in stable condition Infant Feeding: Bottle Infant Disposition:home with mother Discharge instruction: per After Visit Summary and Postpartum booklet. Activity: Advance as tolerated. Pelvic rest for 6 weeks.  Diet: routine diet Anticipated Birth  Control: Condoms Postpartum Appointment:6 weeks Additional Postpartum F/U: Postpartum Depression checkup in 2 weeks  Follow up Visit:  Follow-up Information     Gustavo Lah, CNM. Schedule an appointment as soon as possible for a visit in 2 week(s).   Specialty: Certified Nurse Midwife Why: postpartum mood check Contact information: [address] [place] [address] [phone]         Schermerhorn, Ihor Austin, MD. Schedule an appointment as soon as possible for a visit in 6 week(s).   Specialty: Obstetrics and Gynecology Why: postpartum visit Contact information: [address] [place] [address] [phone]  Plan:  Jo Perkins was discharged to home in good condition. Follow-up appointment as directed.    Signed: Roney Jaffe, CNM

## 2023-08-13 LAB — CBC
HCT: 29.8 % — ABNORMAL LOW (ref 36.0–46.0)
Hemoglobin: 10.1 g/dL — ABNORMAL LOW (ref 12.0–15.0)
MCH: 28.3 pg (ref 26.0–34.0)
MCHC: 33.9 g/dL (ref 30.0–36.0)
MCV: 83.5 fL (ref 80.0–100.0)
Platelets: 262 10*3/uL (ref 150–400)
RBC: 3.57 MIL/uL — ABNORMAL LOW (ref 3.87–5.11)
RDW: 13.2 % (ref 11.5–15.5)
WBC: 23.6 10*3/uL — ABNORMAL HIGH (ref 4.0–10.5)
nRBC: 0 % (ref 0.0–0.2)

## 2023-08-13 MED ORDER — ONDANSETRON HCL 4 MG PO TABS: 4.0000 mg | ORAL_TABLET | ORAL | Status: DC | PRN

## 2023-08-13 MED ORDER — DIBUCAINE (PERIANAL) 1 % EX OINT
1.0000 | TOPICAL_OINTMENT | CUTANEOUS | Status: DC | PRN
  Filled 2023-08-13 (×2): qty 28

## 2023-08-13 MED ORDER — SERTRALINE HCL 100 MG PO TABS
100.0000 mg | ORAL_TABLET | Freq: Every day | ORAL | Status: DC
  Administered 2023-08-13: 100 mg via ORAL
  Filled 2023-08-13: qty 1

## 2023-08-13 MED ORDER — FERROUS SULFATE 325 (65 FE) MG PO TABS
325.0000 mg | ORAL_TABLET | Freq: Two times a day (BID) | ORAL | Status: DC
  Administered 2023-08-13 – 2023-08-14 (×3): 325 mg via ORAL
  Filled 2023-08-13 (×3): qty 1

## 2023-08-13 MED ORDER — DIPHENHYDRAMINE HCL 25 MG PO CAPS: 25.0000 mg | ORAL_CAPSULE | Freq: Four times a day (QID) | ORAL | Status: DC | PRN

## 2023-08-13 MED ORDER — WITCH HAZEL-GLYCERIN EX PADS
1.0000 | MEDICATED_PAD | CUTANEOUS | Status: DC | PRN
Start: 2023-08-13 — End: 2023-08-14
  Filled 2023-08-13 (×2): qty 100

## 2023-08-13 MED ORDER — ZOLPIDEM TARTRATE 5 MG PO TABS: 5.0000 mg | ORAL_TABLET | Freq: Every evening | ORAL | Status: DC | PRN

## 2023-08-13 MED ORDER — COCONUT OIL OIL
1.0000 | TOPICAL_OIL | Status: DC | PRN
Start: 2023-08-13 — End: 2023-08-14

## 2023-08-13 MED ORDER — SIMETHICONE 80 MG PO CHEW: 80.0000 mg | CHEWABLE_TABLET | ORAL | Status: DC | PRN

## 2023-08-13 MED ORDER — ACETAMINOPHEN 500 MG PO TABS
1000.0000 mg | ORAL_TABLET | Freq: Four times a day (QID) | ORAL | Status: DC
  Administered 2023-08-13 – 2023-08-14 (×4): 1000 mg via ORAL
  Filled 2023-08-13 (×4): qty 2

## 2023-08-13 MED ORDER — ACETAMINOPHEN 500 MG PO TABS: 1000.0000 mg | ORAL_TABLET | Freq: Four times a day (QID) | ORAL | Status: DC

## 2023-08-13 MED ORDER — SENNOSIDES-DOCUSATE SODIUM 8.6-50 MG PO TABS
2.0000 | ORAL_TABLET | Freq: Every day | ORAL | Status: DC
  Administered 2023-08-13 – 2023-08-14 (×2): 2 via ORAL
  Filled 2023-08-13 (×2): qty 2

## 2023-08-13 MED ORDER — PRENATAL MULTIVITAMIN CH
1.0000 | ORAL_TABLET | Freq: Every day | ORAL | Status: DC
  Administered 2023-08-13 – 2023-08-14 (×2): 1 via ORAL
  Filled 2023-08-13 (×2): qty 1

## 2023-08-13 MED ORDER — ONDANSETRON HCL 4 MG/2ML IJ SOLN: 4.0000 mg | INTRAMUSCULAR | Status: DC | PRN

## 2023-08-13 MED ORDER — BENZOCAINE-MENTHOL 20-0.5 % EX AERO
1.0000 | INHALATION_SPRAY | CUTANEOUS | Status: DC | PRN
Start: 2023-08-13 — End: 2023-08-14
  Filled 2023-08-13 (×2): qty 56

## 2023-08-13 MED ORDER — OXYCODONE HCL 5 MG PO TABS
5.0000 mg | ORAL_TABLET | ORAL | Status: DC | PRN
  Administered 2023-08-13: 5 mg via ORAL
  Filled 2023-08-13: qty 1

## 2023-08-13 NOTE — Progress Notes (Signed)
Postpartum Day  1  Subjective: 37 y.o. [id number] postpartum day #1 status post forceps delivery. She is ambulating, is tolerating po, is voiding spontaneously.  Her pain is well controlled on PO pain medications. Her lochia is less than menses.  Objective: BP 129/84 (BP Location: Right Arm)   Pulse 92   Temp 98.2 F (36.8 C) (Oral)   Resp 20   Ht 5\' 9"  (1.753 m)   Wt 113.4 kg   SpO2 99%   Breastfeeding Unknown   BMI 36.92 kg/m    Physical Exam:  General: alert, cooperative, and no distress Breasts: soft/nontender Pulm: nl effort Abdomen: soft, non-tender, active bowel sounds Uterine Fundus: firm Perineum: minimal edema, repair well approximated Lochia: appropriate DVT Evaluation: No evidence of DVT seen on physical exam.  Recent Labs    08/12/23 0112 08/13/23 0531  HGB 10.6* 10.1*  HCT 31.3* 29.8*  WBC 11.7* 23.6*  PLT 232 262    Assessment/Plan: 37 y.o. [id number] postpartum day # 1  1. Continue routine postpartum care  2. Infant feeding status: formula feeding -Encouraged snug fitting bra, cold application, Tylenol PRN, and cabbage leaves for engorgement for formula feeding   3. Contraception plan: condoms  4. Maternal iron deficiency anemia - clinically not significant .  -Hemodynamically stable and asymptomatic -Intervention: continue on oral supplementation with ferrous sulfate 325  5. Immunization status:   all immunizations up to date  6. Acute leukocytosis  -WBC 11.7->23.6 -Asymptomatic and afebrile  -Will check CBC tomorrow  Disposition: continue inpatient postpartum care    LOS: 1 day   Gustavo Lah, CNM 08/13/2023, 9:07 AM   ----- Margaretmary Eddy  Certified Nurse Midwife [facility]

## 2023-08-13 NOTE — Op Note (Signed)
NAMEMARIEKE, Jo Perkins MEDICAL RECORD NO: [id number] ACCOUNT NO: [id number] DATE OF BIRTH: June 23, 1986 FACILITY: [facility] LOCATION: [facility] PHYSICIAN: Suzy Bouchard, MD  Operative Report   DATE OF PROCEDURE: 08/12/2023  PREOPERATIVE DIAGNOSES: 1.  39 plus 2 weeks estimated gestational age. 2.  Arrest of descent.  POSTOPERATIVE DIAGNOSES: 1.  39 plus 2 weeks estimated gestational age. 2.  Arrest of descent. 3.  Right occiput transverse position. 4.  Female infant  PROCEDURE:  Low rotational Kielland forceps delivery.  SURGEON:  Suzy Bouchard, MD  ANESTHESIA:  Continuous lumbar epidural.  INDICATIONS:  A 37 year old gravida 0, para 0.  The patient was laboring throughout the day and made it to complete and complete and pushed for 3 hours and was unable to bring the head down to +3 out of 3 station.  I examined the patient and documented  that the head was in the right occiput transverse position verified by ultrasound.  I discussed with the patient and her husband the role of a Kielland rotational forceps delivery.  Risks were discussed with the patient and husband and the couple agreed  for the trial of forceps.  DESCRIPTION OF PROCEDURE:  The Foley catheter was removed.  Kielland forceps were brought up to the operative field.  Perineal prep was performed with Hibiclens with Betadine.  Kielland forceps were then placed with the anterior blade in the classic  maneuver and the posterior blade then followed with good articulation.  Rotation occurred 90 degrees clockwise without difficulty.  Two sets of 3 pushes by mother yielded the head at the perineum and through the perineum.  Forceps were removed and the  head was then placed with gentle traction to deliver the anterior shoulder followed by posterior shoulder and the body of the baby.  Of note, a loose nuchal cord was reduced during this procedure.  Large female was slightly floppy and atonic as I placed  the infant on the  mother's abdomen.  Nursery staff was in attendance and wanted the cord to be clamped and cut as soon as possible, which occurred at 10 seconds post delivery.  Infant was taken to the warmer and female required positive pressure  ventilation and Apgar scores were documented at 3 and 8.  Cord gas were done with the arterial gas 7.31, PCO2 of 49, and base deficit 2.1.  The placenta was delivered 20 minutes after with total placental unit delivered.  Good hemostasis was noted at  this point.  There was noted to be a left vaginal sidewall laceration approximately 7 cm and a central defect.  A rectal exam was performed, which demonstrated no defects in the rectum or colon and sphincter was intact.  The vaginal sidewall laceration  was repaired with a 2-0 Vicryl suture and the central defect also with a 2-0 Vicryl suture.  Good approximation of tissues.  Good hemostasis was noted.  A Crede maneuver was performed at the end, yielded an additional 50 mL clot.  Good uterine tone noted  and a repeat rectal exam at the end of the procedure demonstrated normal tone.  No defect.  No suture noted in the rectum or colon.  The patient tolerated the procedure well.      SUJ D: 08/12/2023 11:49:48 pm T: 08/13/2023 12:28:00 am  JOB: [id number]/ [id number]

## 2023-08-13 NOTE — Anesthesia Postprocedure Evaluation (Signed)
Anesthesia Post Note  Patient: Jo Perkins  Procedure(s) Performed: AN AD HOC LABOR EPIDURAL  Patient location during evaluation: Mother Baby Anesthesia Type: Epidural Level of consciousness: awake and alert Pain management: pain level controlled Vital Signs Assessment: post-procedure vital signs reviewed and stable Respiratory status: spontaneous breathing, nonlabored ventilation and respiratory function stable Cardiovascular status: stable Postop Assessment: no headache, no backache and epidural receding Anesthetic complications: no   No notable events documented.   Last Vitals:  Vitals:   08/13/23 0423 08/13/23 0741  BP: (!) 101/58 129/84  Pulse: 85 92  Resp: 20   Temp: 37.3 C 36.8 C  SpO2: 99% 99%    Last Pain:  Vitals:   08/13/23 0742  TempSrc:   PainSc: 0-No pain                 Romell Cavanah Joanette Gula

## 2023-08-14 LAB — CBC
HCT: 26.1 % — ABNORMAL LOW (ref 36.0–46.0)
Hemoglobin: 8.8 g/dL — ABNORMAL LOW (ref 12.0–15.0)
MCH: 28.1 pg (ref 26.0–34.0)
MCHC: 33.7 g/dL (ref 30.0–36.0)
MCV: 83.4 fL (ref 80.0–100.0)
Platelets: 239 10*3/uL (ref 150–400)
RBC: 3.13 MIL/uL — ABNORMAL LOW (ref 3.87–5.11)
RDW: 13.4 % (ref 11.5–15.5)
WBC: 16.9 10*3/uL — ABNORMAL HIGH (ref 4.0–10.5)
nRBC: 0 % (ref 0.0–0.2)

## 2023-08-14 MED ORDER — COCONUT OIL OIL: 1.0000 | TOPICAL_OIL | Status: AC | PRN

## 2023-08-14 MED ORDER — DIBUCAINE (PERIANAL) 1 % EX OINT: 1.0000 | TOPICAL_OINTMENT | CUTANEOUS | Status: AC | PRN

## 2023-08-14 MED ORDER — BENZOCAINE-MENTHOL 20-0.5 % EX AERO: 1.0000 | INHALATION_SPRAY | CUTANEOUS | Status: AC | PRN

## 2023-08-14 MED ORDER — OXYCODONE HCL 5 MG PO TABS: 5.0000 mg | ORAL_TABLET | ORAL | 0 refills | Status: AC | PRN

## 2023-08-14 MED ORDER — FERROUS SULFATE 325 (65 FE) MG PO TABS: 325.0000 mg | ORAL_TABLET | Freq: Two times a day (BID) | ORAL | Status: AC

## 2023-08-14 MED ORDER — ACETAMINOPHEN 500 MG PO TABS
1000.0000 mg | ORAL_TABLET | Freq: Four times a day (QID) | ORAL | Status: DC
  Administered 2023-08-14: 1000 mg via ORAL
  Filled 2023-08-14: qty 2

## 2023-08-14 MED ORDER — WITCH HAZEL-GLYCERIN EX PADS: 1.0000 | MEDICATED_PAD | CUTANEOUS | Status: AC | PRN

## 2023-08-14 MED ORDER — ACETAMINOPHEN 500 MG PO TABS: 1000.0000 mg | ORAL_TABLET | Freq: Four times a day (QID) | ORAL | 0 refills | Status: AC | PRN

## 2023-08-21 ENCOUNTER — Ambulatory Visit: Payer: Self-pay

## 2023-08-21 NOTE — Telephone Encounter (Signed)
Chief Complaint: Anxiety  Symptoms: moderate anxiety Frequency: constant, chronic  Pertinent Negatives: Patient denies thoughts of self harm or harm to others  Disposition: [] ED /[] Urgent Care (no appt availability in office) / [x] Appointment(In office/virtual)/ []  [facility]/ [] Home Care/ [] Refused Recommended Disposition /[] [facility]/ []  Follow-up with PCP Additional Notes: Patient stated she recently gave birth to her son 8 days ago and has noticed an increase in her anxiety levels. Patient states she maybe experiencing post-partium since having the baby. Patient does have an appointment scheduled Monday with OBGYN to discuss as well. Patient stated she does not have thoughts of self harm or harm to others. Patient reports she is not currently breast feeding either. Patient stated she was taking off clonazepam while pregnant but would like to resume it at this time. Care advice was given and resources given for behavioral health urgent care and [facility]. Patient has appointment schedule with PCP on 08/26/23 as well and wanted to know if Rx can be sent to pharmacy before appointment or will she need to wait until she is seen Monday. Advised I would forward message to PCP for additional recommendations.  Summary: Discontinued rx refill request   Pt requesting refill on clonazePAM (KLONOPIN) 0.5 MG tablet. Pt was taken off of it while pregnant but had her baby and requesting it be filled. Pt scheduled follow up with Dr. Sherrie Mustache for 08/26/2023.     Reason for Disposition  MODERATE anxiety (e.g., persistent or frequent anxiety symptoms; interferes with sleep, school, or work)  Answer Assessment - Initial Assessment Questions 1. CONCERN: "Did anything happen that prompted you to call today?"      My baby is 58 days old  2. ANXIETY SYMPTOMS: "Can you describe how you (your loved one; patient) have been feeling?" (e.g., tense, restless, panicky, anxious, keyed up, overwhelmed, sense of  impending doom).      Anxious 3. ONSET: "How long have you been feeling this way?" (e.g., hours, days, weeks)     8 days  4. SEVERITY: "How would you rate the level of anxiety?" (e.g., 0 - 10; or mild, moderate, severe).     5/10 5. FUNCTIONAL IMPAIRMENT: "How have these feelings affected your ability to do daily activities?" "Have you had more difficulty than usual doing your normal daily activities?" (e.g., getting better, same, worse; self-care, school, work, interactions)     I can get things done  6. HISTORY: "Have you felt this way before?" "Have you ever been diagnosed with an anxiety problem in the past?" (e.g., generalized anxiety disorder, panic attacks, PTSD). If Yes, ask: "How was this problem treated?" (e.g., medicines, counseling, etc.)     Yes, generalized anxiety and depression  7. RISK OF HARM - SUICIDAL IDEATION: "Do you ever have thoughts of hurting or killing yourself?" If Yes, ask:  "Do you have these feelings now?" "Do you have a plan on how you would do this?"     No  8. TREATMENT:  "What has been done so far to treat this anxiety?" (e.g., medicines, relaxation strategies). "What has helped?"     Deep breathing, lavender oil 9. TREATMENT - THERAPIST: "Do you have a counselor or therapist? Name?"     Not at the moment  10. POTENTIAL TRIGGERS: "Do you drink caffeinated beverages (e.g., coffee, colas, teas), and how much daily?" "Do you drink alcohol or use any drugs?" "Have you started any new medicines recently?"       1 coffee daily and  maybe a soda at times  11. PATIENT SUPPORT: "Who is with you now?" "Who do you live with?" "Do you have family or friends who you can talk to?"        My baby and husband. Very good support system  12. OTHER SYMPTOMS: "Do you have any other symptoms?" (e.g., feeling depressed, trouble concentrating, trouble sleeping, trouble breathing, palpitations or fast heartbeat, chest pain, sweating, nausea, or diarrhea)       No  13. PREGNANCY: "Is  there any chance you are pregnant?" "When was your last menstrual period?"       Just gave birth 8 days ago  Protocols used: Anxiety and Panic Attack-A-AH

## 2023-08-22 MED ORDER — CLONAZEPAM 0.5 MG PO TABS: ORAL_TABLET | ORAL | 0 refills | Status: DC

## 2023-08-22 NOTE — Addendum Note (Signed)
Addended by: Malva Limes on: 08/22/2023 08:36 AM   Modules accepted: Orders

## 2023-08-22 NOTE — Telephone Encounter (Signed)
Is she breast feeding?

## 2023-08-22 NOTE — Telephone Encounter (Signed)
Have sent clonazepam prescription to get by until appointment. Is contraindicated if breastfeeding, so she cannot breast feed if she starts back on clonazepam.

## 2023-08-23 NOTE — Telephone Encounter (Signed)
Patient called and advised per Dr. Sherrie Mustache below, she verbalized understanding.

## 2023-08-23 NOTE — Telephone Encounter (Signed)
Detailed VM left per DPR. CRM created. Ok for [facility] to advise/clarify if patient returns call

## 2023-08-26 ENCOUNTER — Ambulatory Visit: Payer: BC Managed Care – PPO | Admitting: Family Medicine

## 2023-08-26 ENCOUNTER — Encounter: Payer: Self-pay | Admitting: Family Medicine

## 2023-08-26 VITALS — BP 110/76 | HR 71 | Ht 69.0 in | Wt 229.0 lb

## 2023-08-26 DIAGNOSIS — F410 Panic disorder [episodic paroxysmal anxiety] without agoraphobia: Secondary | ICD-10-CM | POA: Diagnosis not present

## 2023-08-26 DIAGNOSIS — F439 Reaction to severe stress, unspecified: Secondary | ICD-10-CM

## 2023-08-26 NOTE — Progress Notes (Signed)
Established patient visit   Patient: Jo Perkins   DOB: 1986-06-12   37 y.o. Female  MRN: [id number] Visit Date: 08/26/2023  Today's healthcare provider: Mila Merry, MD   Chief Complaint  Patient presents with   Medical Management of Chronic Issues   Anxiety   Subjective    Discussed the use of AI scribe software for clinical note transcription with the patient, who gave verbal consent to proceed.  History of Present Illness   The patient, a new mother, presents for follow up of chronic anxiety since recently giving birth to her first child. She does report some feelings of sadness, anxiety, and heightened emotions following the birth of her son, which she describes as "baby blues." She has been taking sertraline prior to and during her pregnancy which has been effective. she had had also been taking occasional clonazepam prior to pregnancy, but this was discontinued during the pregnancy. she is not and does not plan to breast feed and would like to go back on prn clonazepam, which she feels she may need to take a little more often while adjust to motherhood.  mental health check-in.       Medications: Outpatient Medications Prior to Visit  Medication Sig   clonazePAM (KLONOPIN) 0.5 MG tablet TAKE 1/2 TO 1 TABLET(0.25 TO 0.5 MG) BY MOUTH TWICE DAILY AS NEEDED FOR ANXIETY   sertraline (ZOLOFT) 100 MG tablet Take 100 mg by mouth at bedtime.   acetaminophen (TYLENOL) 500 MG tablet Take 2 tablets (1,000 mg total) by mouth every 6 (six) hours as needed. (Patient not taking: Reported on 08/26/2023)   benzocaine-Menthol (DERMOPLAST) 20-0.5 % AERO Apply 1 Application topically as needed for irritation (perineal discomfort). (Patient not taking: Reported on 08/26/2023)   coconut oil OIL Apply 1 Application topically as needed. (Patient not taking: Reported on 08/26/2023)   dibucaine (NUPERCAINAL) 1 % OINT Place 1 Application rectally as needed for hemorrhoids. (Patient not taking:  Reported on 08/26/2023)   ferrous sulfate 325 (65 FE) MG tablet Take 1 tablet (325 mg total) by mouth 2 (two) times daily with a meal. (Patient not taking: Reported on 08/26/2023)   Prenatal Vit-Fe Fumarate-FA (MULTIVITAMIN-PRENATAL) 27-0.8 MG TABS tablet Take 1 tablet by mouth daily at 12 noon. (Patient not taking: Reported on 08/26/2023)   witch hazel-glycerin (TUCKS) pad Apply 1 Application topically as needed for hemorrhoids. (Patient not taking: Reported on 08/26/2023)   No facility-administered medications prior to visit.      Objective    BP 110/76 (BP Location: Left Arm, Patient Position: Sitting, Cuff Size: Large)   Pulse 71   Ht 5\' 9"  (1.753 m)   Wt 229 lb (103.9 kg)   SpO2 100%   Breastfeeding No   BMI 33.82 kg/m   Physical Exam   General appearance: Well developed, well nourished female, cooperative and in no acute distress Head: Normocephalic, without obvious abnormality, atraumatic Respiratory: Respirations even and unlabored, normal respiratory rate Extremities: All extremities are intact.  Skin: Skin color, texture, turgor normal. No rashes seen  Psych: Appropriate mood and affect. Neurologic: Mental status: Alert, oriented to person, place, and time, thought content appropriate.    Assessment & Plan     1. Situational stress Doing well 2 weeks post partum with Ob follow up tomorrow. Is not breast feeding and has prescription for clonazepam that she takes prn. Will send refill for #30 before the end of the week.   2. Panic disorder Continues on sertraline  prescribed by her Ob/Gyn, and has been doing well with this for several years.       Mila Merry, MD  [facility] [phone] (phone) [phone] (fax)  [facility]

## 2023-08-30 ENCOUNTER — Other Ambulatory Visit: Payer: Self-pay | Admitting: Family Medicine

## 2023-08-30 DIAGNOSIS — F439 Reaction to severe stress, unspecified: Secondary | ICD-10-CM

## 2023-08-30 MED ORDER — CLONAZEPAM 0.5 MG PO TABS: ORAL_TABLET | ORAL | 2 refills | Status: DC

## 2024-03-11 ENCOUNTER — Telehealth: Payer: Self-pay

## 2024-03-11 DIAGNOSIS — Z111 Encounter for screening for respiratory tuberculosis: Secondary | ICD-10-CM

## 2024-03-11 NOTE — Telephone Encounter (Signed)
 Copied from CRM [id number]. Topic: Clinical - Request for Lab/Test Order >> Mar 11, 2024  2:42 PM Yolanda T wrote: Reason for CRM: patient called said she need a TB test for employment. Please f/u with patient

## 2024-03-14 NOTE — Addendum Note (Signed)
 Addended by: GASPER NANCYANN BRAVO on: 03/14/2024 02:29 PM   Modules accepted: Orders

## 2024-03-14 NOTE — Telephone Encounter (Signed)
 Have placed order for Tb Quantiferon

## 2024-03-17 NOTE — Telephone Encounter (Signed)
 Message left on VM that labslip was available

## 2024-04-03 ENCOUNTER — Other Ambulatory Visit: Payer: Self-pay | Admitting: Family Medicine

## 2024-04-03 DIAGNOSIS — F439 Reaction to severe stress, unspecified: Secondary | ICD-10-CM

## 2024-04-03 NOTE — Telephone Encounter (Signed)
 Copied from CRM [id number]. Topic: Clinical - Medication Refill >> Apr 03, 2024  3:34 PM Nathanel BROCKS wrote: Medication: clonazePAM  (KLONOPIN ) 0.5 MG tablet  Has the patient contacted their pharmacy? No  This is the patient's preferred pharmacy:  [facility]  [address] [place] [address] Phone: [phone] Is this the correct pharmacy for this prescription? Yes If no, delete pharmacy and type the correct one.   Has the prescription been filled recently? Yes  Is the patient out of the medication? Yes  Has the patient been seen for an appointment in the last year OR does the patient have an upcoming appointment? No  Can we respond through MyChart? Yes  Agent: Please be advised that Rx refills may take up to 3 business days. We ask that you follow-up with your pharmacy.

## 2024-04-06 NOTE — Telephone Encounter (Signed)
 Requested medication (s) are due for refill today -yes  Requested medication (s) are on the active medication list -yes  Future visit scheduled -no  Last refill: 08/30/23 #30 2RF  Notes to clinic: non delegated Rx  Requested Prescriptions  Pending Prescriptions Disp Refills   clonazePAM  (KLONOPIN ) 0.5 MG tablet 30 tablet 2    Sig: TAKE 1/2 TO 1 TABLET(0.25 TO 0.5 MG) BY MOUTH TWICE DAILY AS NEEDED FOR ANXIETY     Not Delegated - Psychiatry: Anxiolytics/Hypnotics 2 Failed - 04/06/2024  3:12 PM      Failed - This refill cannot be delegated      Failed - Urine Drug Screen completed in last 360 days      Failed - Valid encounter within last 6 months    Recent Outpatient Visits   None            Passed - Patient is not pregnant         Requested Prescriptions  Pending Prescriptions Disp Refills   clonazePAM  (KLONOPIN ) 0.5 MG tablet 30 tablet 2    Sig: TAKE 1/2 TO 1 TABLET(0.25 TO 0.5 MG) BY MOUTH TWICE DAILY AS NEEDED FOR ANXIETY     Not Delegated - Psychiatry: Anxiolytics/Hypnotics 2 Failed - 04/06/2024  3:12 PM      Failed - This refill cannot be delegated      Failed - Urine Drug Screen completed in last 360 days      Failed - Valid encounter within last 6 months    Recent Outpatient Visits   None            Passed - Patient is not pregnant

## 2024-04-07 MED ORDER — CLONAZEPAM 0.5 MG PO TABS: ORAL_TABLET | ORAL | 4 refills | Status: DC

## 2024-09-23 ENCOUNTER — Other Ambulatory Visit: Payer: Self-pay | Admitting: Family Medicine

## 2024-09-23 DIAGNOSIS — F439 Reaction to severe stress, unspecified: Secondary | ICD-10-CM

## 2024-09-23 NOTE — Telephone Encounter (Signed)
 Copied from CRM [id number]. Topic: Clinical - Prescription Issue >> Sep 23, 2024  2:49 PM Willma R wrote: Reason for CRM: Patients prescription for clonazePAM  (KLONOPIN ) 0.5 MG tablet needs to be sent to an alternative pharmacy. Please send to: [id number] [place], [place] - [address] AT [facility] [address] [place] [address] Phone: [phone] Fax: [phone]  Patient can be reached at [phone]

## 2024-09-24 MED ORDER — CLONAZEPAM 0.5 MG PO TABS: ORAL_TABLET | ORAL | 1 refills | Status: AC

## 2024-09-24 NOTE — Telephone Encounter (Signed)
 Requested medications are due for refill today.  yes  Requested medications are on the active medications list.  yes  Last refill. 04/07/2024 #30 4 rf  Future visit scheduled.   no  Notes to clinic.  Refill not delegated. Pt last seen 1 year ago.    Requested Prescriptions  Pending Prescriptions Disp Refills   clonazePAM  (KLONOPIN ) 0.5 MG tablet 30 tablet 4    Sig: TAKE 1/2 TO 1 TABLET(0.25 TO 0.5 MG) BY MOUTH TWICE DAILY AS NEEDED FOR ANXIETY     Not Delegated - Psychiatry: Anxiolytics/Hypnotics 2 Failed - 09/24/2024  2:00 PM      Failed - This refill cannot be delegated      Failed - Urine Drug Screen completed in last 360 days      Failed - Valid encounter within last 6 months    Recent Outpatient Visits   None            Passed - Patient is not pregnant
